# Patient Record
Sex: Male | Born: 1950 | Race: White | Marital: Married | Smoking: Never smoker
Health system: Southern US, Community
[De-identification: ages and names within clinical notes are randomized; demographics above are authoritative.]

## PROBLEM LIST (undated history)

## (undated) DIAGNOSIS — G4733 Obstructive sleep apnea (adult) (pediatric): Secondary | ICD-10-CM

## (undated) DIAGNOSIS — G4730 Sleep apnea, unspecified: Secondary | ICD-10-CM

## (undated) DIAGNOSIS — Z974 Presence of external hearing-aid: Secondary | ICD-10-CM

## (undated) DIAGNOSIS — E119 Type 2 diabetes mellitus without complications: Secondary | ICD-10-CM

## (undated) DIAGNOSIS — K2270 Barrett's esophagus without dysplasia: Secondary | ICD-10-CM

## (undated) DIAGNOSIS — R42 Dizziness and giddiness: Secondary | ICD-10-CM

## (undated) DIAGNOSIS — E291 Testicular hypofunction: Secondary | ICD-10-CM

## (undated) DIAGNOSIS — M1990 Unspecified osteoarthritis, unspecified site: Secondary | ICD-10-CM

## (undated) DIAGNOSIS — I10 Essential (primary) hypertension: Secondary | ICD-10-CM

---

## 2019-11-13 ENCOUNTER — Ambulatory Visit: Payer: Self-pay

## 2019-11-19 ENCOUNTER — Ambulatory Visit: Payer: Self-pay

## 2019-11-24 ENCOUNTER — Ambulatory Visit: Payer: Self-pay

## 2020-10-10 ENCOUNTER — Encounter: Payer: Self-pay | Admitting: Internal Medicine

## 2020-10-13 HISTORY — PX: BACK SURGERY: SHX140

## 2021-06-19 ENCOUNTER — Other Ambulatory Visit: Payer: Self-pay | Admitting: Family Medicine

## 2021-06-19 DIAGNOSIS — M5416 Radiculopathy, lumbar region: Secondary | ICD-10-CM

## 2021-07-10 ENCOUNTER — Ambulatory Visit
Admission: RE | Admit: 2021-07-10 | Discharge: 2021-07-10 | Disposition: A | Discharge status: Home / Self Care | Payer: Self-pay | Source: Ambulatory Visit | Attending: Family Medicine | Admitting: Family Medicine

## 2021-07-10 ENCOUNTER — Other Ambulatory Visit: Payer: Self-pay

## 2021-07-10 DIAGNOSIS — M5416 Radiculopathy, lumbar region: Secondary | ICD-10-CM

## 2021-09-12 HISTORY — PX: BACK SURGERY: SHX140

## 2022-04-25 ENCOUNTER — Other Ambulatory Visit: Payer: Self-pay

## 2022-04-25 ENCOUNTER — Inpatient Hospital Stay
Admission: EM | Admit: 2022-04-25 | Discharge: 2022-04-27 | DRG: 683 | Disposition: A | Discharge status: Home / Self Care | Payer: Medicare Other | Attending: Internal Medicine | Admitting: Internal Medicine

## 2022-04-25 DIAGNOSIS — E785 Hyperlipidemia, unspecified: Secondary | ICD-10-CM | POA: Diagnosis present

## 2022-04-25 DIAGNOSIS — Z6836 Body mass index (BMI) 36.0-36.9, adult: Secondary | ICD-10-CM | POA: Diagnosis not present

## 2022-04-25 DIAGNOSIS — E871 Hypo-osmolality and hyponatremia: Secondary | ICD-10-CM | POA: Diagnosis present

## 2022-04-25 DIAGNOSIS — N179 Acute kidney failure, unspecified: Principal | ICD-10-CM | POA: Diagnosis present

## 2022-04-25 DIAGNOSIS — E860 Dehydration: Secondary | ICD-10-CM | POA: Diagnosis present

## 2022-04-25 DIAGNOSIS — E876 Hypokalemia: Secondary | ICD-10-CM | POA: Diagnosis present

## 2022-04-25 DIAGNOSIS — R197 Diarrhea, unspecified: Secondary | ICD-10-CM | POA: Diagnosis present

## 2022-04-25 DIAGNOSIS — E119 Type 2 diabetes mellitus without complications: Secondary | ICD-10-CM | POA: Diagnosis present

## 2022-04-25 DIAGNOSIS — E1169 Type 2 diabetes mellitus with other specified complication: Secondary | ICD-10-CM

## 2022-04-25 DIAGNOSIS — E669 Obesity, unspecified: Secondary | ICD-10-CM | POA: Diagnosis present

## 2022-04-25 DIAGNOSIS — A020 Salmonella enteritis: Secondary | ICD-10-CM | POA: Diagnosis not present

## 2022-04-25 DIAGNOSIS — R7303 Prediabetes: Secondary | ICD-10-CM | POA: Diagnosis not present

## 2022-04-25 DIAGNOSIS — I10 Essential (primary) hypertension: Secondary | ICD-10-CM | POA: Diagnosis present

## 2022-04-25 DIAGNOSIS — A029 Salmonella infection, unspecified: Secondary | ICD-10-CM | POA: Diagnosis not present

## 2022-04-25 LAB — COMPREHENSIVE METABOLIC PANEL
ALT: 29 U/L (ref 0–44)
AST: 31 U/L (ref 15–41)
Albumin: 4.3 g/dL (ref 3.5–5.0)
Alkaline Phosphatase: 60 U/L (ref 38–126)
Anion gap: 16 — ABNORMAL HIGH (ref 5–15)
BUN: 45 mg/dL — ABNORMAL HIGH (ref 8–23)
CO2: 17 mmol/L — ABNORMAL LOW (ref 22–32)
Calcium: 8.5 mg/dL — ABNORMAL LOW (ref 8.9–10.3)
Chloride: 96 mmol/L — ABNORMAL LOW (ref 98–111)
Creatinine, Ser: 2.61 mg/dL — ABNORMAL HIGH (ref 0.61–1.24)
GFR, Estimated: 26 mL/min — ABNORMAL LOW (ref >60)
Glucose, Bld: 142 mg/dL — ABNORMAL HIGH (ref 70–99)
Potassium: 3.4 mmol/L — ABNORMAL LOW (ref 3.5–5.1)
Sodium: 129 mmol/L — ABNORMAL LOW (ref 135–145)
Total Bilirubin: 1 mg/dL (ref 0.3–1.2)
Total Protein: 8.4 g/dL — ABNORMAL HIGH (ref 6.5–8.1)

## 2022-04-25 LAB — CBC
HCT: 43.9 % (ref 39.0–52.0)
Hemoglobin: 14.5 g/dL (ref 13.0–17.0)
MCH: 29.2 pg (ref 26.0–34.0)
MCHC: 33 g/dL (ref 30.0–36.0)
MCV: 88.5 fL (ref 80.0–100.0)
Platelets: 316 10*3/uL (ref 150–400)
RBC: 4.96 MIL/uL (ref 4.22–5.81)
RDW: 14.9 % (ref 11.5–15.5)
WBC: 7.6 10*3/uL (ref 4.0–10.5)
nRBC: 0 % (ref 0.0–0.2)

## 2022-04-25 LAB — LIPASE, BLOOD: Lipase: 25 U/L (ref 11–51)

## 2022-04-25 MED ORDER — ALUM & MAG HYDROXIDE-SIMETH 200-200-20 MG/5ML PO SUSP
30.0000 mL | Freq: Once | ORAL | Status: AC
  Administered 2022-04-25: 30 mL via ORAL
  Filled 2022-04-25: qty 30

## 2022-04-25 MED ORDER — LIDOCAINE VISCOUS HCL 2 % MT SOLN
15.0000 mL | Freq: Once | OROMUCOSAL | Status: AC
  Administered 2022-04-25: 15 mL via ORAL
  Filled 2022-04-25: qty 15

## 2022-04-25 MED ORDER — SODIUM CHLORIDE 0.9 % IV SOLN: Freq: Once | INTRAVENOUS | Status: AC

## 2022-04-25 MED ORDER — SODIUM CHLORIDE 0.9 % IV BOLUS
1000.0000 mL | Freq: Once | INTRAVENOUS | Status: AC
  Administered 2022-04-25: 1000 mL via INTRAVENOUS

## 2022-04-25 NOTE — ED Triage Notes (Signed)
Pt to ED for nonstop watery diarrhea since 5 days ago. Has been dizzy for last several days. Has vomited about once/day. Is drinking water.   Pt was shivering yesterday, temp was 99.4. Has not taken antipyretics.  Denies blood in stool. Has been taking immodium with no relief. Denies recent abx use.  HR is 120. BP is 116/60 which pt states is low for him.

## 2022-04-25 NOTE — ED Notes (Signed)
Pt reports hiccoughs that last 4-5 hours at a time.  6 episodes of watery diarrhea since arriving in the ED.  Pt reports no abdominal pain

## 2022-04-25 NOTE — ED Provider Triage Note (Signed)
Emergency Medicine Provider Triage Evaluation Note  Kyle Washington , a 71 y.o. male  was evaluated in triage.  Pt complains of watery diarrhea x 5 days. No known fever. Vomiting 1x/day but able to tolerate fluids.  Physical Exam  BP 116/60   Pulse (!) 120   Temp 99 F (37.2 C) (Oral)   Resp 16   SpO2 93%  Gen:   Awake, no distress   Resp:  Normal effort  MSK:   Moves extremities without difficulty  Other:    Medical Decision Making  Medically screening exam initiated at 4:15 PM.  Appropriate orders placed.  Nikita Humble was informed that the remainder of the evaluation will be completed by another provider, this initial triage assessment does not replace that evaluation, and the importance of remaining in the ED until their evaluation is complete.    Chinita Pester, FNP 04/25/22 1620

## 2022-04-25 NOTE — ED Provider Notes (Signed)
[facility] Provider Note    Event Date/Time   First MD Initiated Contact with Patient 04/25/22 2054     (approximate)   History   Diarrhea and Dizziness   HPI  Kyle Washington is a 71 y.o. male who presents to the emergency department today because of concerns for diarrhea.  Started 5 days ago.  He has numerous episodes of diarrhea.  They are watery.  The patient states that it has been almost constant.  He has had to wear pull-ups because of it.  He denies any significant abdominal pain with this.  Has had nausea and decreased appetite.  Denies any unusual ingestions prior to the symptoms.  Denies any recent antibiotic use.  Denies any recent travel.  Denies history of abdominal issues.     Physical Exam   Triage Vital Signs: ED Triage Vitals  Enc Vitals Group     BP 04/25/22 1613 116/60     Pulse Rate 04/25/22 1613 (!) 120     Resp 04/25/22 1613 16     Temp 04/25/22 1613 99 F (37.2 C)     Temp Source 04/25/22 1613 Oral     SpO2 04/25/22 1613 93 %     Weight 04/25/22 1615 160 lb (72.6 kg)     Height 04/25/22 1615 6' (1.829 m)     Head Circumference --      Peak Flow --      Pain Score 04/25/22 1612 0     Pain Loc --      Pain Edu? --      Excl. in GC? --     Most recent vital signs: Vitals:   04/25/22 2038 04/25/22 2039  BP: (!) 114/57   Pulse: (!) 103 (!) 102  Resp: 17   Temp:    SpO2: 95% 96%    General: Awake, alert, oriented. CV:  Good peripheral perfusion. Tachycardia. Resp:  Normal effort. Lungs clear. Abd:  No distention. Non tender.    ED Results / Procedures / Treatments   Labs (all labs ordered are listed, but only abnormal results are displayed) Labs Reviewed  COMPREHENSIVE METABOLIC PANEL - Abnormal; Notable for the following components:      Result Value   Sodium 129 (*)    Potassium 3.4 (*)    Chloride 96 (*)    CO2 17 (*)    Glucose, Bld 142 (*)    BUN 45 (*)    Creatinine, Ser 2.61 (*)    Calcium 8.5  (*)    Total Protein 8.4 (*)    GFR, Estimated 26 (*)    Anion gap 16 (*)    All other components within normal limits  GASTROINTESTINAL PANEL BY PCR, STOOL (REPLACES STOOL CULTURE)  LIPASE, BLOOD  CBC  URINALYSIS, ROUTINE W REFLEX MICROSCOPIC     EKG  None   RADIOLOGY None   PROCEDURES:  Critical Care performed: No  Procedures   MEDICATIONS ORDERED IN ED: Medications  sodium chloride 0.9 % bolus 1,000 mL (0 mLs Intravenous Stopped 04/25/22 2039)     IMPRESSION / MDM / ASSESSMENT AND PLAN / ED COURSE  I reviewed the triage vital signs and the nursing notes.                              Differential diagnosis includes, but is not limited to, C. difficile, viral gastroenteritis, inflammatory bowel syndrome.  Patient's presentation is  most consistent with acute presentation with potential threat to life or bodily function.  Patient presented to the emergency department today because of concerns for profound diarrhea for the past 5 days.  Patient's blood work here does show AKI.  Additionally he has multiple electrolyte abnormalities.  Think this is related to volume depletion.  Will order stool studies.  We will plan on admission.  Additionally patient was having some hiccups so we will try GI cocktail to see if that helps. Discussed with Dr. Emmit Pomfret with the hospitalist service who will plan on admission.   FINAL CLINICAL IMPRESSION(S) / ED DIAGNOSES   Final diagnoses:  AKI (acute kidney injury) (HCC)  Diarrhea, unspecified type     Note:  This document was prepared using Dragon voice recognition software and may include unintentional dictation errors.    Phineas Semen, MD 04/25/22 [phone]

## 2022-04-25 NOTE — H&P (Signed)
History and Physical   [facility] [place] @ [facility] Admission History and Physical [employer], D.O.    Patient Name: Kyle Washington MR#: [id number] Date of Birth: 05/14/1951 Date of Admission: 04/25/2022  Referring MD/NP/PA: Dr. Derrill Kay Primary Care Physician: Lauro Regulus, MD  Chief Complaint:  Chief Complaint  Patient presents with   Diarrhea   Dizziness    HPI: Kyle Washington is a 71 y.o. male with a known history of HTN, HLD presents to the emergency department for evaluation of diarrhea.  Patient was in a usual state of health until five days ago when he began having watery diarrhea. Reports associated chills. .Has been eating and drinking. Denies recent antibiotic use.   Patient denies fevers, weakness, dizziness, chest pain, shortness of breath, N/V, abdominal pain, dysuria/frequency, changes in mental status.    Otherwise there has been no change in status. Patient has been taking medication as prescribed and there has been no recent change in medication or diet.  No recent antibiotics.  There has been no recent illness, hospitalizations, travel or sick contacts.    EMS/ED Course: Patient received NS, Maalox. Medical admission has been requested for further management of AKI 2/2 dehydration.  Review of Systems:  CONSTITUTIONAL: Positive chills. No fever, fatigue, weakness, weight gain/loss, headache. EYES: No blurry or double vision. ENT: No tinnitus, postnasal drip, redness or soreness of the oropharynx. RESPIRATORY: No cough, dyspnea, wheeze.  No hemoptysis.  CARDIOVASCULAR: No chest pain, palpitations, syncope, orthopnea. No lower extremity edema.  GASTROINTESTINAL: Positive watery diarrhea. No nausea, vomiting, abdominal pain, constipation.  No hematemesis, melena or hematochezia. GENITOURINARY: No dysuria, frequency, hematuria. ENDOCRINE: No polyuria or nocturia. No heat or cold intolerance. HEMATOLOGY: No anemia, bruising,  bleeding. INTEGUMENTARY: No rashes, ulcers, lesions. MUSCULOSKELETAL: No arthritis, gout, dyspnea. NEUROLOGIC: No numbness, tingling, ataxia, seizure-type activity, weakness. PSYCHIATRIC: No anxiety, depression, insomnia.   History reviewed. No pertinent past medical history.  Past Surgical History:  Procedure Laterality Date   BACK SURGERY  2022     reports that he has never smoked. He has never used smokeless tobacco. He reports current alcohol use. No history on file for drug use.  No Known Allergies  History reviewed. No pertinent family history.  Prior to Admission medications   Not on File    Physical Exam: Vitals:   04/25/22 1615 04/25/22 2038 04/25/22 2039 04/25/22 2100  BP:  (!) 114/57  110/61  Pulse:  (!) 103 (!) 102 98  Resp:  17  16  Temp:      TempSrc:      SpO2:  95% 96% 98%  Weight: 72.6 kg     Height: 6' (1.829 m)       GENERAL: 71 y.o.-year-old white male patient, well-developed, well-nourished lying in the bed in no acute distress.  Pleasant and cooperative.   HEENT: Head atraumatic, normocephalic. Pupils equal. Mucus membranes moist. NECK: Supple. No JVD. CHEST: Normal breath sounds bilaterally. No wheezing, rales, rhonchi or crackles. No use of accessory muscles of respiration.  No reproducible chest wall tenderness.  CARDIOVASCULAR: S1, S2 normal. No murmurs, rubs, or gallops. Cap refill <2 seconds. Pulses intact distally.  ABDOMEN: Soft, nondistended, nontender. No rebound, guarding, rigidity. Normoactive bowel sounds present in all four quadrants.  EXTREMITIES: No pedal edema, cyanosis, or clubbing. No calf tenderness or Homan's sign.  NEUROLOGIC: The patient is alert and oriented x 3. Cranial nerves II through XII are grossly intact with no focal sensorimotor deficit. PSYCHIATRIC:  Normal affect,  mood, thought content. SKIN: Warm, dry, and intact without obvious rash, lesion, or ulcer.    Labs on Admission:  CBC: Recent Labs  Lab  04/25/22 1617  WBC 7.6  HGB 14.5  HCT 43.9  MCV 88.5  PLT 316   Basic Metabolic Panel: Recent Labs  Lab 04/25/22 1617  NA 129*  K 3.4*  CL 96*  CO2 17*  GLUCOSE 142*  BUN 45*  CREATININE 2.61*  CALCIUM 8.5*   GFR: Estimated Creatinine Clearance: 27 mL/min (A) (by C-G formula based on SCr of 2.61 mg/dL (H)). Liver Function Tests: Recent Labs  Lab 04/25/22 1617  AST 31  ALT 29  ALKPHOS 60  BILITOT 1.0  PROT 8.4*  ALBUMIN 4.3   Recent Labs  Lab 04/25/22 1617  LIPASE 25   No results for input(s): "AMMONIA" in the last 168 hours. Coagulation Profile: No results for input(s): "INR", "PROTIME" in the last 168 hours. Cardiac Enzymes: No results for input(s): "CKTOTAL", "CKMB", "CKMBINDEX", "TROPONINI" in the last 168 hours. BNP (last 3 results) No results for input(s): "PROBNP" in the last 8760 hours. HbA1C: No results for input(s): "HGBA1C" in the last 72 hours. CBG: No results for input(s): "GLUCAP" in the last 168 hours. Lipid Profile: No results for input(s): "CHOL", "HDL", "LDLCALC", "TRIG", "CHOLHDL", "LDLDIRECT" in the last 72 hours. Thyroid Function Tests: No results for input(s): "TSH", "T4TOTAL", "FREET4", "T3FREE", "THYROIDAB" in the last 72 hours. Anemia Panel: No results for input(s): "VITAMINB12", "FOLATE", "FERRITIN", "TIBC", "IRON", "RETICCTPCT" in the last 72 hours. Urine analysis: No results found for: "COLORURINE", "APPEARANCEUR", "LABSPEC", "PHURINE", "GLUCOSEU", "HGBUR", "BILIRUBINUR", "KETONESUR", "PROTEINUR", "UROBILINOGEN", "NITRITE", "LEUKOCYTESUR" Sepsis Labs: @LABRCNTIP (procalcitonin:4,lacticidven:4) )No results found for this or any previous visit (from the past 240 hour(s)).   Radiological Exams on Admission: No results found.   Assessment/Plan  This is a 70 y.o. male with a history of HTN  now being admitted with:  #. Acute kidney injury likely 2/2 dehydration, diarrhea - Admit IP - IV fluids and repeat BMP in AM.  - Avoid  nephrotoxic medications  #. Mild hyponatremia 2/2 diarrhea - IVF  #. Watery diarrhea - Check stool studies - Contact precautions - Empiric abx started in ER  #. History of HTN - Continue Norvasc, Cozaar  #. History of HLD - Continue Lipitor  Admission status: IP IV Fluids: NS Diet/Nutrition: Heart healthy Consults called: None  DVT Px: Lovenox, SCDs and early ambulation. Code Status: Full Code  Disposition Plan: To home in 1-2 days  All the records are reviewed and case discussed with ED provider. Management plans discussed with the patient and/or family who express understanding and agree with plan of care.  Bralynn Velador D.O. on 04/25/2022 at 9:48 PM CC: Primary care physician; 04/27/2022, MD   04/25/2022, 9:48 PM

## 2022-04-26 DIAGNOSIS — R7303 Prediabetes: Secondary | ICD-10-CM

## 2022-04-26 DIAGNOSIS — A020 Salmonella enteritis: Secondary | ICD-10-CM

## 2022-04-26 LAB — GASTROINTESTINAL PANEL BY PCR, STOOL (REPLACES STOOL CULTURE)

## 2022-04-26 LAB — BASIC METABOLIC PANEL
Anion gap: 14 (ref 5–15)
BUN: 48 mg/dL — ABNORMAL HIGH (ref 8–23)
CO2: 17 mmol/L — ABNORMAL LOW (ref 22–32)
Calcium: 8.4 mg/dL — ABNORMAL LOW (ref 8.9–10.3)
Chloride: 96 mmol/L — ABNORMAL LOW (ref 98–111)
Creatinine, Ser: 2.35 mg/dL — ABNORMAL HIGH (ref 0.61–1.24)
GFR, Estimated: 29 mL/min — ABNORMAL LOW (ref >60)
Glucose, Bld: 123 mg/dL — ABNORMAL HIGH (ref 70–99)
Potassium: 2.6 mmol/L — CL (ref 3.5–5.1)
Sodium: 127 mmol/L — ABNORMAL LOW (ref 135–145)

## 2022-04-26 LAB — CBC
HCT: 42.6 % (ref 39.0–52.0)
Hemoglobin: 14.6 g/dL (ref 13.0–17.0)
MCH: 29.9 pg (ref 26.0–34.0)
MCHC: 34.3 g/dL (ref 30.0–36.0)
MCV: 87.3 fL (ref 80.0–100.0)
Platelets: 277 10*3/uL (ref 150–400)
RBC: 4.88 MIL/uL (ref 4.22–5.81)
RDW: 14.7 % (ref 11.5–15.5)
WBC: 5.4 10*3/uL (ref 4.0–10.5)
nRBC: 0 % (ref 0.0–0.2)

## 2022-04-26 LAB — C DIFFICILE QUICK SCREEN W PCR REFLEX
C Diff antigen: NEGATIVE
C Diff interpretation: NOT DETECTED
C Diff toxin: NEGATIVE

## 2022-04-26 LAB — HIV ANTIBODY (ROUTINE TESTING W REFLEX): HIV Screen 4th Generation wRfx: NONREACTIVE

## 2022-04-26 LAB — MAGNESIUM: Magnesium: 1.8 mg/dL (ref 1.7–2.4)

## 2022-04-26 LAB — PHOSPHORUS: Phosphorus: 4 mg/dL (ref 2.5–4.6)

## 2022-04-26 MED ORDER — METRONIDAZOLE 500 MG/100ML IV SOLN
500.0000 mg | Freq: Two times a day (BID) | INTRAVENOUS | Status: DC
  Administered 2022-04-26 (×2): 500 mg via INTRAVENOUS
  Filled 2022-04-26 (×2): qty 100

## 2022-04-26 MED ORDER — ESCITALOPRAM OXALATE 10 MG PO TABS
20.0000 mg | ORAL_TABLET | Freq: Every day | ORAL | Status: DC
  Administered 2022-04-26 – 2022-04-27 (×2): 20 mg via ORAL
  Filled 2022-04-26 (×2): qty 2

## 2022-04-26 MED ORDER — MAGNESIUM SULFATE 2 GM/50ML IV SOLN
2.0000 g | Freq: Once | INTRAVENOUS | Status: AC
  Administered 2022-04-26: 2 g via INTRAVENOUS
  Filled 2022-04-26: qty 50

## 2022-04-26 MED ORDER — LOSARTAN POTASSIUM 50 MG PO TABS
100.0000 mg | ORAL_TABLET | Freq: Every day | ORAL | Status: DC
Start: 2022-04-26 — End: 2022-04-26

## 2022-04-26 MED ORDER — CIPROFLOXACIN IN D5W 400 MG/200ML IV SOLN
400.0000 mg | Freq: Two times a day (BID) | INTRAVENOUS | Status: DC
  Administered 2022-04-26 (×2): 400 mg via INTRAVENOUS
  Filled 2022-04-26 (×2): qty 200

## 2022-04-26 MED ORDER — ACETAMINOPHEN 325 MG PO TABS: 650.0000 mg | ORAL_TABLET | Freq: Four times a day (QID) | ORAL | Status: DC | PRN

## 2022-04-26 MED ORDER — MORPHINE SULFATE (PF) 2 MG/ML IV SOLN: 1.0000 mg | Freq: Four times a day (QID) | INTRAVENOUS | Status: DC | PRN

## 2022-04-26 MED ORDER — ENOXAPARIN SODIUM 40 MG/0.4ML IJ SOSY
40.0000 mg | PREFILLED_SYRINGE | INTRAMUSCULAR | Status: DC
  Administered 2022-04-27: 40 mg via SUBCUTANEOUS
  Filled 2022-04-26: qty 0.4

## 2022-04-26 MED ORDER — HYDROCODONE-ACETAMINOPHEN 5-325 MG PO TABS: 1.0000 | ORAL_TABLET | ORAL | Status: DC | PRN

## 2022-04-26 MED ORDER — ENOXAPARIN SODIUM 30 MG/0.3ML IJ SOSY
30.0000 mg | PREFILLED_SYRINGE | INTRAMUSCULAR | Status: DC
  Administered 2022-04-26: 30 mg via SUBCUTANEOUS
  Filled 2022-04-26: qty 0.3

## 2022-04-26 MED ORDER — TRAZODONE HCL 50 MG PO TABS
50.0000 mg | ORAL_TABLET | Freq: Every evening | ORAL | Status: DC | PRN
  Filled 2022-04-26: qty 1

## 2022-04-26 MED ORDER — ACETAMINOPHEN 650 MG RE SUPP: 650.0000 mg | Freq: Four times a day (QID) | RECTAL | Status: DC | PRN

## 2022-04-26 MED ORDER — AMLODIPINE BESYLATE 5 MG PO TABS
5.0000 mg | ORAL_TABLET | Freq: Every day | ORAL | Status: DC
  Administered 2022-04-26 – 2022-04-27 (×2): 5 mg via ORAL
  Filled 2022-04-26 (×2): qty 1

## 2022-04-26 MED ORDER — CHOLESTYRAMINE 4 G PO PACK
4.0000 g | PACK | Freq: Two times a day (BID) | ORAL | Status: DC
  Administered 2022-04-26 – 2022-04-27 (×3): 4 g via ORAL
  Filled 2022-04-26 (×3): qty 1

## 2022-04-26 MED ORDER — ONDANSETRON HCL 4 MG PO TABS
4.0000 mg | ORAL_TABLET | Freq: Four times a day (QID) | ORAL | Status: DC | PRN
  Administered 2022-04-27: 4 mg via ORAL
  Filled 2022-04-26: qty 1

## 2022-04-26 MED ORDER — SODIUM CHLORIDE 0.9 % IV SOLN: INTRAVENOUS | Status: DC

## 2022-04-26 MED ORDER — POTASSIUM CHLORIDE CRYS ER 20 MEQ PO TBCR
40.0000 meq | EXTENDED_RELEASE_TABLET | Freq: Two times a day (BID) | ORAL | Status: AC
  Administered 2022-04-26 (×2): 40 meq via ORAL
  Filled 2022-04-26 (×2): qty 2

## 2022-04-26 MED ORDER — ONDANSETRON HCL 4 MG/2ML IJ SOLN
4.0000 mg | Freq: Four times a day (QID) | INTRAMUSCULAR | Status: DC | PRN
  Administered 2022-04-26 (×3): 4 mg via INTRAVENOUS
  Filled 2022-04-26 (×3): qty 2

## 2022-04-26 MED ORDER — PROCHLORPERAZINE EDISYLATE 10 MG/2ML IJ SOLN
10.0000 mg | Freq: Four times a day (QID) | INTRAMUSCULAR | Status: DC | PRN
  Administered 2022-04-26 – 2022-04-27 (×2): 10 mg via INTRAVENOUS
  Filled 2022-04-26 (×3): qty 2

## 2022-04-26 MED ORDER — ATORVASTATIN CALCIUM 20 MG PO TABS
20.0000 mg | ORAL_TABLET | Freq: Every day | ORAL | Status: DC
  Administered 2022-04-26 – 2022-04-27 (×2): 20 mg via ORAL
  Filled 2022-04-26 (×2): qty 1

## 2022-04-26 MED ORDER — VITAMIN B-12 1000 MCG PO TABS
1000.0000 ug | ORAL_TABLET | Freq: Every day | ORAL | Status: DC
  Administered 2022-04-26 – 2022-04-27 (×2): 1000 ug via ORAL
  Filled 2022-04-26 (×3): qty 1

## 2022-04-26 MED ORDER — CHLORPROMAZINE HCL 25 MG PO TABS
25.0000 mg | ORAL_TABLET | Freq: Four times a day (QID) | ORAL | Status: DC | PRN
  Filled 2022-04-26: qty 1

## 2022-04-26 MED ORDER — ALBUTEROL SULFATE (2.5 MG/3ML) 0.083% IN NEBU: 2.5000 mg | INHALATION_SOLUTION | Freq: Four times a day (QID) | RESPIRATORY_TRACT | Status: DC | PRN

## 2022-04-26 MED ORDER — IPRATROPIUM BROMIDE 0.02 % IN SOLN: 0.5000 mg | Freq: Four times a day (QID) | RESPIRATORY_TRACT | Status: DC | PRN

## 2022-04-26 NOTE — Plan of Care (Signed)

## 2022-04-26 NOTE — Progress Notes (Signed)
Pt attempted to use bedside commode but was not able to make it without it getting onto the floor and bed. Pt very weak and takes time to get to the [facility] from the bed. Frequent events of liquidy greenish stool.  Patient is now back in bed and is resting with spouse at the bedside. Will continue to monitor to end of shift.

## 2022-04-26 NOTE — Progress Notes (Signed)
PROGRESS NOTE    Kyle Washington  OLM:[id number] DOB: [birth year] DOA: 04/25/2022 PCP: Lauro Regulus, MD   Assessment & Plan:   Principal Problem:   AKI (acute kidney injury) (HCC)  Assessment and Plan: AKI: likely secondary to dehydration & diarrhea. Continue on IVFs. Cr is trending down today   Hyponatremia: continue on IVFs  Hypokalemia: potassium given   Diarrhea: secondary to salmonella found on GI PCR panel. C. Diff was neg. Will continue w/ supportive care. Hold off on further abxs as abxs are for severe cases only   HTN: continue on amlodipine. Hold losartan secondary to AKI   HLD: continue on statin   Obesity: BMI 36.1. Complicates overall care & prognosis  Pre-DM: HbA1c 6.3. Needs pre-DM education   DVT prophylaxis: lovenox  Code Status: full  Family Communication:  Disposition Plan: will likely d/c back home   Level of care: Med-Surg  Status is: Inpatient Remains inpatient appropriate because: severity of illness   Consultants:    Procedures:  Antimicrobials:   Subjective: Pt c/o diarrhea  Objective: Vitals:   04/26/22 0531 04/26/22 0649 04/26/22 0654 04/26/22 0811  BP: 110/70 124/76  137/74  Pulse: (!) 104 (!) 101  98  Resp: 19 20  18   Temp: 97.8 F (36.6 C) 98.6 F (37 C)  98 F (36.7 C)  TempSrc: Oral     SpO2: 96% 95%  95%  Weight:   120.9 kg   Height:        Intake/Output Summary (Last 24 hours) at 04/26/2022 1444 Last data filed at 04/26/2022 1413 Gross per 24 hour  Intake 1009.67 ml  Output --  Net 1009.67 ml   Filed Weights   04/25/22 1615 04/26/22 0654  Weight: 72.6 kg 120.9 kg    Examination:  General exam: Appears calm and comfortable  Respiratory system: Clear to auscultation. Respiratory effort normal. Cardiovascular system: S1 & S2 +. No rubs, gallops or clicks.  Gastrointestinal system: Abdomen is obese, soft and nontender.  Hyperactive bowel sounds heard. Central nervous system: Alert and oriented. Moves  all extremities  Psychiatry: Judgement and insight appear normal. Mood & affect appropriate.     Data Reviewed: I have personally reviewed following labs and imaging studies  CBC: Recent Labs  Lab 04/25/22 1617 04/26/22 0738  WBC 7.6 5.4  HGB 14.5 14.6  HCT 43.9 42.6  MCV 88.5 87.3  PLT 316 277   Basic Metabolic Panel: Recent Labs  Lab 04/25/22 1617 04/26/22 0738 04/26/22 0830  NA 129* 127*  --   K 3.4* 2.6*  --   CL 96* 96*  --   CO2 17* 17*  --   GLUCOSE 142* 123*  --   BUN 45* 48*  --   CREATININE 2.61* 2.35*  --   CALCIUM 8.5* 8.4*  --   MG  --   --  1.8  PHOS  --   --  4.0   GFR: Estimated Creatinine Clearance: 39.3 mL/min (A) (by C-G formula based on SCr of 2.35 mg/dL (H)). Liver Function Tests: Recent Labs  Lab 04/25/22 1617  AST 31  ALT 29  ALKPHOS 60  BILITOT 1.0  PROT 8.4*  ALBUMIN 4.3   Recent Labs  Lab 04/25/22 1617  LIPASE 25   No results for input(s): "AMMONIA" in the last 168 hours. Coagulation Profile: No results for input(s): "INR", "PROTIME" in the last 168 hours. Cardiac Enzymes: No results for input(s): "CKTOTAL", "CKMB", "CKMBINDEX", "TROPONINI" in the last 168 hours. BNP (  last 3 results) No results for input(s): "PROBNP" in the last 8760 hours. HbA1C: No results for input(s): "HGBA1C" in the last 72 hours. CBG: No results for input(s): "GLUCAP" in the last 168 hours. Lipid Profile: No results for input(s): "CHOL", "HDL", "LDLCALC", "TRIG", "CHOLHDL", "LDLDIRECT" in the last 72 hours. Thyroid Function Tests: No results for input(s): "TSH", "T4TOTAL", "FREET4", "T3FREE", "THYROIDAB" in the last 72 hours. Anemia Panel: No results for input(s): "VITAMINB12", "FOLATE", "FERRITIN", "TIBC", "IRON", "RETICCTPCT" in the last 72 hours. Sepsis Labs: No results for input(s): "PROCALCITON", "LATICACIDVEN" in the last 168 hours.  Recent Results (from the past 240 hour(s))  Gastrointestinal Panel by PCR , Stool     Status: Abnormal    Collection Time: 04/26/22  8:50 AM   Specimen: Stool  Result Value Ref Range Status   Campylobacter species NOT DETECTED NOT DETECTED Final   Plesimonas shigelloides NOT DETECTED NOT DETECTED Final   Salmonella species DETECTED (A) NOT DETECTED Final    Comment: RESULT CALLED TO, READ BACK BY AND VERIFIED WITH: ELIZABETH RICHARDSON 04/26/22 1024 SLM    Yersinia enterocolitica NOT DETECTED NOT DETECTED Final   Vibrio species NOT DETECTED NOT DETECTED Final   Vibrio cholerae NOT DETECTED NOT DETECTED Final   Enteroaggregative E coli (EAEC) NOT DETECTED NOT DETECTED Final   Enteropathogenic E coli (EPEC) NOT DETECTED NOT DETECTED Final   Enterotoxigenic E coli (ETEC) NOT DETECTED NOT DETECTED Final   Shiga like toxin producing E coli (STEC) NOT DETECTED NOT DETECTED Final   Shigella/Enteroinvasive E coli (EIEC) NOT DETECTED NOT DETECTED Final   Cryptosporidium NOT DETECTED NOT DETECTED Final   Cyclospora cayetanensis NOT DETECTED NOT DETECTED Final   Entamoeba histolytica NOT DETECTED NOT DETECTED Final   Giardia lamblia NOT DETECTED NOT DETECTED Final   Adenovirus F40/41 NOT DETECTED NOT DETECTED Final   Astrovirus NOT DETECTED NOT DETECTED Final   Norovirus GI/GII NOT DETECTED NOT DETECTED Final   Rotavirus A NOT DETECTED NOT DETECTED Final   Sapovirus (I, II, IV, and V) NOT DETECTED NOT DETECTED Final    Comment: Performed at [facility], [address] [place], [place] [address]  C Difficile Quick Screen w PCR reflex     Status: None   Collection Time: 04/26/22  8:50 AM   Specimen: Stool  Result Value Ref Range Status   C Diff antigen NEGATIVE NEGATIVE Final   C Diff toxin NEGATIVE NEGATIVE Final   C Diff interpretation No C. difficile detected.  Final    Comment: Performed at [facility], [address] [place], [place] [address]         Radiology Studies: No results found.      Scheduled Meds:  amLODipine  5 mg Oral Daily   atorvastatin  20  mg Oral Daily   cholestyramine  4 g Oral BID   [START ON 04/27/2022] enoxaparin (LOVENOX) injection  40 mg Subcutaneous Q24H   escitalopram  20 mg Oral Daily   potassium chloride  40 mEq Oral BID   vitamin B-12  1,000 mcg Oral Daily   Continuous Infusions:  sodium chloride 75 mL/hr at 04/26/22 1413   magnesium sulfate bolus IVPB       LOS: 1 day    Time spent: 35 mins     Charise Killian, MD [facility] Pager [phone]  If 7PM-7AM, please contact night-coverage [email or web address] 04/26/2022, 2:44 PM

## 2022-04-26 NOTE — Progress Notes (Signed)
PHARMACIST - PHYSICIAN COMMUNICATION  CONCERNING:  Enoxaparin (Lovenox) for DVT Prophylaxis    RECOMMENDATION: Patient was prescribed enoxaparin 30 mg q24 hours for VTE prophylaxis.   Filed Weights   04/25/22 1615 04/26/22 0654  Weight: 72.6 kg (160 lb) 120.9 kg (266 lb 9.6 oz)    Body mass index is 36.16 kg/m.  Estimated Creatinine Clearance: 39.3 mL/min (A) (by C-G formula based on SCr of 2.35 mg/dL (H)).   Based on [facility] policy patient is candidate for enoxaparin 0.5mg /kg TBW SQ every 24 hours based on BMI being >30.  Given borderline CrCl (down-trending), will increase Lovenox to 40 mg daily for now with consideration for 0.5 mg/kg/day dosing if Scr continues to improve  DESCRIPTION: Pharmacy has adjusted enoxaparin dose per [facility] policy.  Patient is now receiving enoxaparin 40 mg every 24 hours    Tressie Ellis 04/26/2022 10:37 AM

## 2022-04-27 DIAGNOSIS — E876 Hypokalemia: Secondary | ICD-10-CM

## 2022-04-27 DIAGNOSIS — A029 Salmonella infection, unspecified: Secondary | ICD-10-CM

## 2022-04-27 LAB — BASIC METABOLIC PANEL
Anion gap: 15 (ref 5–15)
BUN: 44 mg/dL — ABNORMAL HIGH (ref 8–23)
CO2: 18 mmol/L — ABNORMAL LOW (ref 22–32)
Calcium: 8.9 mg/dL (ref 8.9–10.3)
Chloride: 98 mmol/L (ref 98–111)
Creatinine, Ser: 2.04 mg/dL — ABNORMAL HIGH (ref 0.61–1.24)
GFR, Estimated: 34 mL/min — ABNORMAL LOW (ref >60)
Glucose, Bld: 144 mg/dL — ABNORMAL HIGH (ref 70–99)
Potassium: 2.9 mmol/L — ABNORMAL LOW (ref 3.5–5.1)
Sodium: 131 mmol/L — ABNORMAL LOW (ref 135–145)

## 2022-04-27 LAB — CBC
HCT: 45.6 % (ref 39.0–52.0)
Hemoglobin: 15.1 g/dL (ref 13.0–17.0)
MCH: 28.6 pg (ref 26.0–34.0)
MCHC: 33.1 g/dL (ref 30.0–36.0)
MCV: 86.4 fL (ref 80.0–100.0)
Platelets: 318 10*3/uL (ref 150–400)
RBC: 5.28 MIL/uL (ref 4.22–5.81)
RDW: 14.5 % (ref 11.5–15.5)
WBC: 6.2 10*3/uL (ref 4.0–10.5)
nRBC: 0 % (ref 0.0–0.2)

## 2022-04-27 LAB — PHOSPHORUS: Phosphorus: 3.7 mg/dL (ref 2.5–4.6)

## 2022-04-27 LAB — MAGNESIUM: Magnesium: 2.7 mg/dL — ABNORMAL HIGH (ref 1.7–2.4)

## 2022-04-27 MED ORDER — CHOLESTYRAMINE 4 G PO PACK: 4.0000 g | PACK | Freq: Two times a day (BID) | ORAL | 0 refills | Status: DC

## 2022-04-27 MED ORDER — ONDANSETRON HCL 8 MG PO TABS: 8.0000 mg | ORAL_TABLET | Freq: Three times a day (TID) | ORAL | 0 refills | Status: AC | PRN

## 2022-04-27 MED ORDER — POTASSIUM CHLORIDE CRYS ER 20 MEQ PO TBCR
40.0000 meq | EXTENDED_RELEASE_TABLET | Freq: Two times a day (BID) | ORAL | Status: DC
  Administered 2022-04-27: 40 meq via ORAL
  Filled 2022-04-27: qty 2

## 2022-04-27 NOTE — TOC CM/SW Note (Signed)
  Transition of Care [facility]) Screening Note   Patient Details  Name: Kyle Washington Date of Birth: 1951/07/15   Transition of Care [facility]) CM/SW Contact:    Margarito Liner, LCSW Phone Number: 04/27/2022, 11:47 AM    Transition of Care Department [facility]) has reviewed patient and no TOC needs have been identified at this time. We will continue to monitor patient advancement through interdisciplinary progression rounds. If new patient transition needs arise, please place a TOC consult.

## 2022-04-27 NOTE — Discharge Summary (Signed)
Physician Discharge Summary  [name] NKN:[id number] DOB: [birth year] DOA: 04/25/2022  PCP: Lauro Regulus, MD  Admit date: 04/25/2022 Discharge date: 04/27/2022  Admitted From: home  Disposition:  home   Recommendations for Outpatient Follow-up:  Follow up with PCP in 1-2 weeks   Home Health: no  Equipment/Devices:  Discharge Condition: stable  CODE STATUS: full  Diet recommendation: Heart Healthy / Carb Modified   Brief/Interim Summary: HPI was taken from Dr. Emmit Pomfret: Kyle Washington is a 71 y.o. male with a known history of HTN, HLD presents to the emergency department for evaluation of diarrhea.  Patient was in a usual state of health until five days ago when he began having watery diarrhea. Reports associated chills. .Has been eating and drinking. Denies recent antibiotic use or sick contacts.  Also c/o hiccups x 2 days   Patient denies fevers, weakness, dizziness, chest pain, shortness of breath, N/V, abdominal pain, dysuria/frequency, changes in mental status.    Otherwise there has been no change in status. Patient has been taking medication as prescribed and there has been no recent change in medication or diet.  No recent antibiotics.  There has been no recent illness, hospitalizations, travel or sick contacts.     EMS/ED Course: Patient received NS, Maalox. Medical admission has been requested for further management of AKI 2/2 dehydration.  As per Dr. Mayford Knife 7/15-7/16/23: Pt presented w/ diarrhea secondary to salmonella found on GI PCR panel. C. diff testing was neg. Pt was treated w/ supportive care w/ IVFs, electrolyte replacement and cholestyramine to help bulk up stools. No fevers and normal WBC throughout hospital stay. Of note, pt was found to AKI likely secondary to dehydration & diarrhea. Cr was trending down on the day of d/c.  Discharge Diagnoses:  Principal Problem:   AKI (acute kidney injury) (HCC) AKI: likely secondary to dehydration & diarrhea.  Continue on IVFs. Cr is trending down daily   Hyponatremia: improving w/ IVFs   Hypokalemia: KCl repleated  Diarrhea: secondary to salmonella found on GI PCR panel. C. diff was neg. Will continue w/ supportive care. Hold off on further abxs as abxs are for severe cases only   HTN: continue on amlodipine. Can restart losartan at d/c   HLD: continue on statin   Obesity: BMI 36.1. Complicates overall care & prognosis  DM2: HbA1c 6.1, well controlled. Continue on home anti-DM meds at d/c    Discharge Instructions  Discharge Instructions     Diet - low sodium heart healthy   Complete by: As directed    Diet Carb Modified   Complete by: As directed    Discharge instructions   Complete by: As directed    F/u w/ PCP in 1-2 weeks. Stay hydrated with water, pedialyte   Increase activity slowly   Complete by: As directed       Allergies as of 04/27/2022   No Known Allergies      Medication List     TAKE these medications    amLODipine 5 MG tablet Commonly known as: NORVASC Take 5 mg by mouth daily.   atorvastatin 20 MG tablet Commonly known as: LIPITOR Take 20 mg by mouth daily.   cholestyramine 4 g packet Commonly known as: QUESTRAN Take 1 packet (4 g total) by mouth 2 (two) times daily for 14 days.   escitalopram 20 MG tablet Commonly known as: LEXAPRO Take 20 mg by mouth daily.   Fish Oil 1000 MG Caps Take 1 capsule by mouth daily.  losartan 100 MG tablet Commonly known as: COZAAR Take 100 mg by mouth daily.   Melatonin 10 MG Tabs Take 10 mg by mouth at bedtime.   omeprazole 20 MG capsule Commonly known as: PRILOSEC Take 20 mg by mouth daily.   ondansetron 8 MG tablet Commonly known as: Zofran Take 1 tablet (8 mg total) by mouth every 8 (eight) hours as needed for up to 10 days for nausea or vomiting.   One-A-Day Mens 50+ Advantage Tabs Take 1 tablet by mouth daily.   Ozempic (0.25 or 0.5 MG/DOSE) 2 MG/3ML Sopn Generic drug: Semaglutide(0.25 or  0.5MG /DOS) Inject 0.75 mg into the skin once a week.   Qsymia 7.5-46 MG Cp24 Generic drug: Phentermine-Topiramate Take 1 capsule by mouth every morning. What changed: Another medication with the same name was removed. Continue taking this medication, and follow the directions you see here.   testosterone cypionate 200 MG/ML injection Commonly known as: DEPOTESTOSTERONE CYPIONATE SMARTSIG:0.45 Milliliter(s) IM Every 2 Weeks   traZODone 50 MG tablet Commonly known as: DESYREL Take 50-100 mg by mouth at bedtime as needed.   vitamin B-12 1000 MCG tablet Commonly known as: CYANOCOBALAMIN Take 1,000 mcg by mouth daily.   Vitamin D-1000 Max St 25 MCG (1000 UT) tablet Generic drug: Cholecalciferol Take 1,000 Units by mouth daily.   Xigduo XR 02-999 MG Tb24 Generic drug: Dapagliflozin-metFORMIN HCl ER Take 2 tablets by mouth every morning.        No Known Allergies  Consultations:    Procedures/Studies: No results found. (Echo, Carotid, EGD, Colonoscopy, ERCP)    Subjective: Pt c/o diarrhea but improved frequency of episodes from day prior    Discharge Exam: Vitals:   04/27/22 0422 04/27/22 0750  BP: 138/75 (!) 144/84  Pulse: 83 80  Resp: 16 16  Temp: 98.9 F (37.2 C) 98.5 F (36.9 C)  SpO2: 94% 96%   Vitals:   04/26/22 1946 04/27/22 0029 04/27/22 0422 04/27/22 0750  BP: 128/74  138/75 (!) 144/84  Pulse: 94  83 80  Resp: 20 14 16 16   Temp: 98.9 F (37.2 C)  98.9 F (37.2 C) 98.5 F (36.9 C)  TempSrc: Oral  Oral Oral  SpO2: 92%  94% 96%  Weight:      Height:        General: Pt is alert, awake, not in acute distress Cardiovascular: S1/S2 +, no rubs, no gallops Respiratory: CTA bilaterally, no wheezing, no rhonchi Abdominal: Soft, NT, obese, bowel sounds + Extremities: no cyanosis    The results of significant diagnostics from this hospitalization (including imaging, microbiology, ancillary and laboratory) are listed below for reference.      Microbiology: Recent Results (from the past 240 hour(s))  Gastrointestinal Panel by PCR , Stool     Status: Abnormal   Collection Time: 04/26/22  8:50 AM   Specimen: Stool  Result Value Ref Range Status   Campylobacter species NOT DETECTED NOT DETECTED Final   Plesimonas shigelloides NOT DETECTED NOT DETECTED Final   Salmonella species DETECTED (A) NOT DETECTED Final    Comment: RESULT CALLED TO, READ BACK BY AND VERIFIED WITH: ELIZABETH RICHARDSON 04/26/22 1024 SLM    Yersinia enterocolitica NOT DETECTED NOT DETECTED Final   Vibrio species NOT DETECTED NOT DETECTED Final   Vibrio cholerae NOT DETECTED NOT DETECTED Final   Enteroaggregative E coli (EAEC) NOT DETECTED NOT DETECTED Final   Enteropathogenic E coli (EPEC) NOT DETECTED NOT DETECTED Final   Enterotoxigenic E coli (ETEC) NOT DETECTED NOT DETECTED  Final   Shiga like toxin producing E coli (STEC) NOT DETECTED NOT DETECTED Final   Shigella/Enteroinvasive E coli (EIEC) NOT DETECTED NOT DETECTED Final   Cryptosporidium NOT DETECTED NOT DETECTED Final   Cyclospora cayetanensis NOT DETECTED NOT DETECTED Final   Entamoeba histolytica NOT DETECTED NOT DETECTED Final   Giardia lamblia NOT DETECTED NOT DETECTED Final   Adenovirus F40/41 NOT DETECTED NOT DETECTED Final   Astrovirus NOT DETECTED NOT DETECTED Final   Norovirus GI/GII NOT DETECTED NOT DETECTED Final   Rotavirus A NOT DETECTED NOT DETECTED Final   Sapovirus (I, II, IV, and V) NOT DETECTED NOT DETECTED Final    Comment: Performed at [facility], [address] [place], [place] [address]  C Difficile Quick Screen w PCR reflex     Status: None   Collection Time: 04/26/22  8:50 AM   Specimen: Stool  Result Value Ref Range Status   C Diff antigen NEGATIVE NEGATIVE Final   C Diff toxin NEGATIVE NEGATIVE Final   C Diff interpretation No C. difficile detected.  Final    Comment: Performed at [facility], [address] [place], [place] [address]      Labs: BNP (last 3 results) No results for input(s): "BNP" in the last 8760 hours. Basic Metabolic Panel: Recent Labs  Lab 04/25/22 1617 04/26/22 0738 04/26/22 0830 04/27/22 0411  NA 129* 127*  --  131*  K 3.4* 2.6*  --  2.9*  CL 96* 96*  --  98  CO2 17* 17*  --  18*  GLUCOSE 142* 123*  --  144*  BUN 45* 48*  --  44*  CREATININE 2.61* 2.35*  --  2.04*  CALCIUM 8.5* 8.4*  --  8.9  MG  --   --  1.8 2.7*  PHOS  --   --  4.0 3.7   Liver Function Tests: Recent Labs  Lab 04/25/22 1617  AST 31  ALT 29  ALKPHOS 60  BILITOT 1.0  PROT 8.4*  ALBUMIN 4.3   Recent Labs  Lab 04/25/22 1617  LIPASE 25   No results for input(s): "AMMONIA" in the last 168 hours. CBC: Recent Labs  Lab 04/25/22 1617 04/26/22 0738 04/27/22 0411  WBC 7.6 5.4 6.2  HGB 14.5 14.6 15.1  HCT 43.9 42.6 45.6  MCV 88.5 87.3 86.4  PLT 316 277 318   Cardiac Enzymes: No results for input(s): "CKTOTAL", "CKMB", "CKMBINDEX", "TROPONINI" in the last 168 hours. BNP: Invalid input(s): "POCBNP" CBG: No results for input(s): "GLUCAP" in the last 168 hours. D-Dimer No results for input(s): "DDIMER" in the last 72 hours. Hgb A1c No results for input(s): "HGBA1C" in the last 72 hours. Lipid Profile No results for input(s): "CHOL", "HDL", "LDLCALC", "TRIG", "CHOLHDL", "LDLDIRECT" in the last 72 hours. Thyroid function studies No results for input(s): "TSH", "T4TOTAL", "T3FREE", "THYROIDAB" in the last 72 hours.  Invalid input(s): "FREET3" Anemia work up No results for input(s): "VITAMINB12", "FOLATE", "FERRITIN", "TIBC", "IRON", "RETICCTPCT" in the last 72 hours. Urinalysis No results found for: "COLORURINE", "APPEARANCEUR", "LABSPEC", "PHURINE", "GLUCOSEU", "HGBUR", "BILIRUBINUR", "KETONESUR", "PROTEINUR", "UROBILINOGEN", "NITRITE", "LEUKOCYTESUR" Sepsis Labs Recent Labs  Lab 04/25/22 1617 04/26/22 0738 04/27/22 0411  WBC 7.6 5.4 6.2   Microbiology Recent Results (from the past 240 hour(s))   Gastrointestinal Panel by PCR , Stool     Status: Abnormal   Collection Time: 04/26/22  8:50 AM   Specimen: Stool  Result Value Ref Range Status   Campylobacter species NOT DETECTED NOT  DETECTED Final   Plesimonas shigelloides NOT DETECTED NOT DETECTED Final   Salmonella species DETECTED (A) NOT DETECTED Final    Comment: RESULT CALLED TO, READ BACK BY AND VERIFIED WITH: ELIZABETH RICHARDSON 04/26/22 1024 SLM    Yersinia enterocolitica NOT DETECTED NOT DETECTED Final   Vibrio species NOT DETECTED NOT DETECTED Final   Vibrio cholerae NOT DETECTED NOT DETECTED Final   Enteroaggregative E coli (EAEC) NOT DETECTED NOT DETECTED Final   Enteropathogenic E coli (EPEC) NOT DETECTED NOT DETECTED Final   Enterotoxigenic E coli (ETEC) NOT DETECTED NOT DETECTED Final   Shiga like toxin producing E coli (STEC) NOT DETECTED NOT DETECTED Final   Shigella/Enteroinvasive E coli (EIEC) NOT DETECTED NOT DETECTED Final   Cryptosporidium NOT DETECTED NOT DETECTED Final   Cyclospora cayetanensis NOT DETECTED NOT DETECTED Final   Entamoeba histolytica NOT DETECTED NOT DETECTED Final   Giardia lamblia NOT DETECTED NOT DETECTED Final   Adenovirus F40/41 NOT DETECTED NOT DETECTED Final   Astrovirus NOT DETECTED NOT DETECTED Final   Norovirus GI/GII NOT DETECTED NOT DETECTED Final   Rotavirus A NOT DETECTED NOT DETECTED Final   Sapovirus (I, II, IV, and V) NOT DETECTED NOT DETECTED Final    Comment: Performed at [facility], [address] [place], [place] [address]  C Difficile Quick Screen w PCR reflex     Status: None   Collection Time: 04/26/22  8:50 AM   Specimen: Stool  Result Value Ref Range Status   C Diff antigen NEGATIVE NEGATIVE Final   C Diff toxin NEGATIVE NEGATIVE Final   C Diff interpretation No C. difficile detected.  Final    Comment: Performed at [facility], [address] [place], [place] [address]     Time coordinating discharge: Over 30  minutes  SIGNED:   Wyvonnia Dusky, MD  [facility] 04/27/2022, 12:21 PM Pager   If 7PM-7AM, please contact night-coverage [email or web address]

## 2022-05-16 IMAGING — MR MR LUMBAR SPINE W/O CM
4 of 8 series · 14 of 48 positions shown · non-contrast
Comparison: None.

CLINICAL DATA: Low back and right leg pain with weakness and
numbness.

EXAM:
MRI LUMBAR SPINE WITHOUT CONTRAST
TECHNIQUE: Multiplanar, multisequence MR imaging of the lumbar spine was
performed. No intravenous contrast was administered.

[Series 5: T2 · sagittal · 4.0mm · 0.73mm/px · 3 of 15 slices shown (1 of 4)]
[im 1/15]
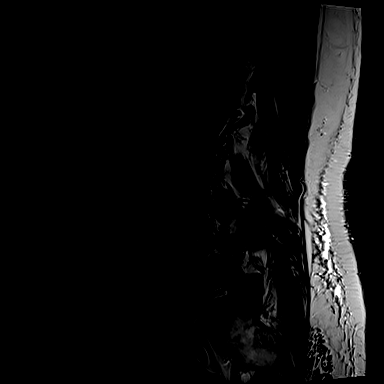
[im 8/15]
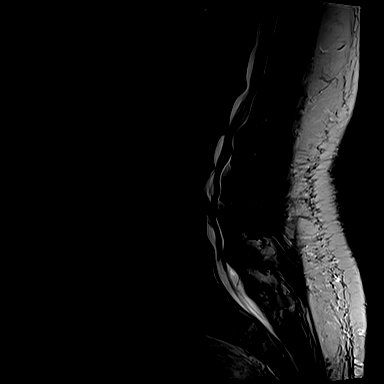
[im 15/15]
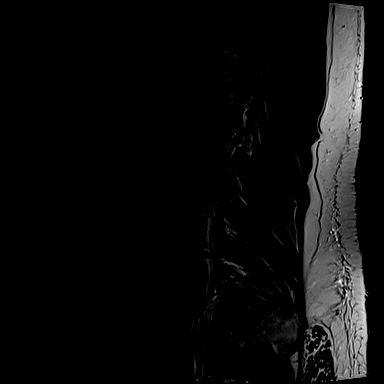

[Series 10: T2 · axial · 4.0mm · 0.28mm/px · z∈[+48,+137]mm · 5 of 19 slices shown (2 of 4)]
[im 1/19]
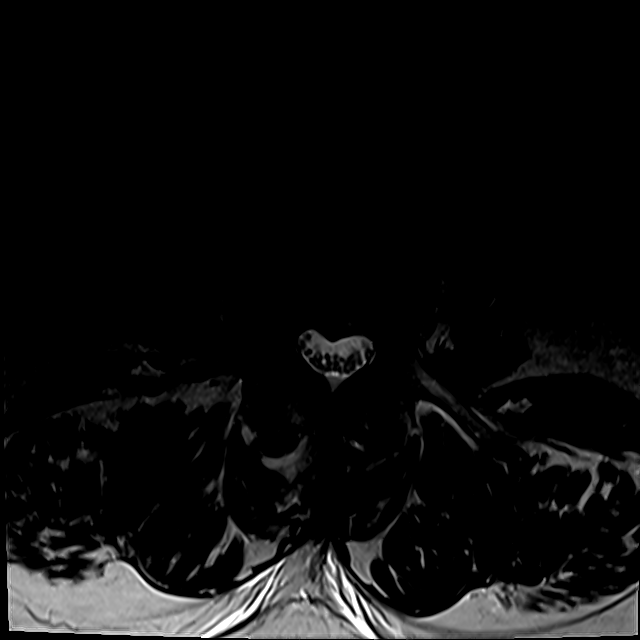
[im 5/19]
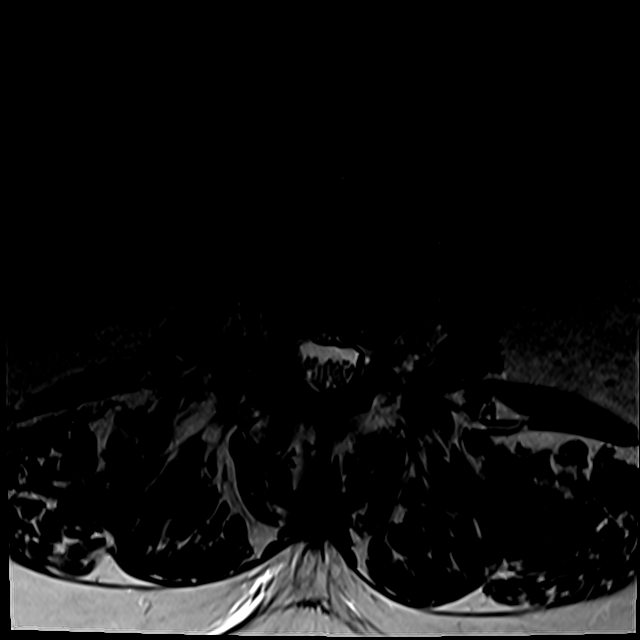
[im 10/19]
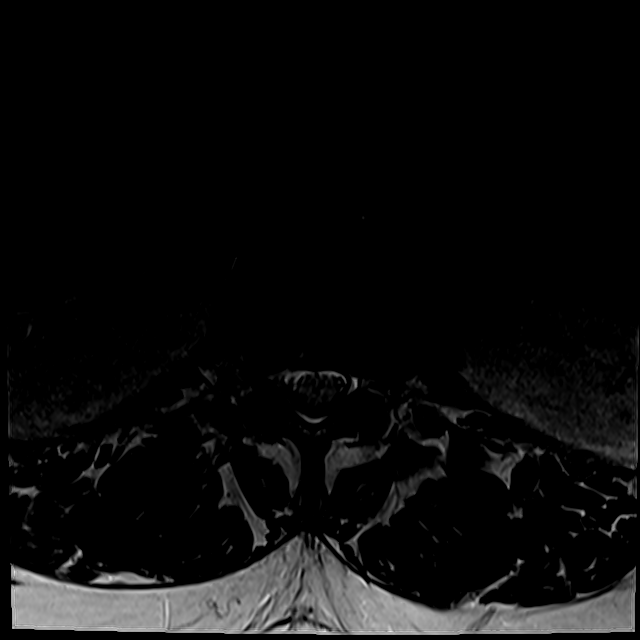
[im 14/19]
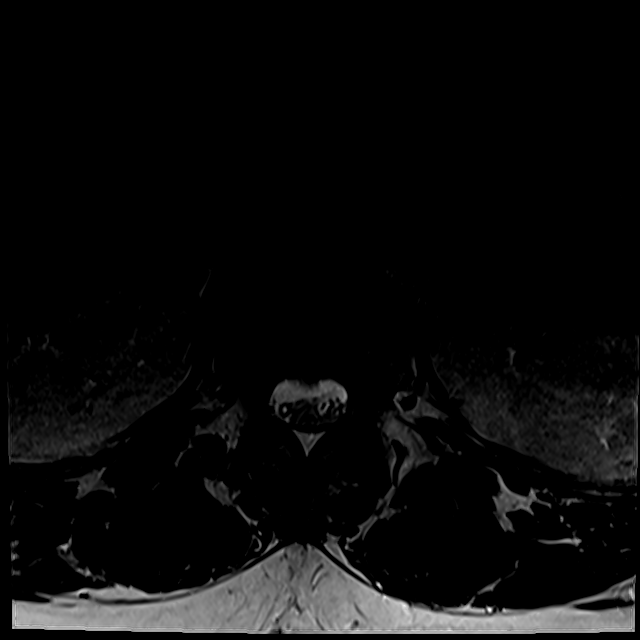
[im 19/19]
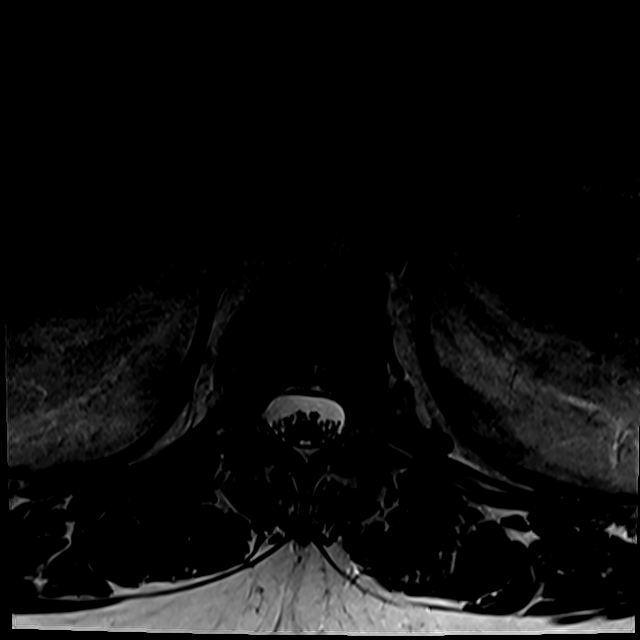

[Series 11: T2 · axial · 4.0mm · 0.28mm/px · z∈[-80,+13]mm · 3 of 20 slices shown (3 of 4)]
[im 1/20]
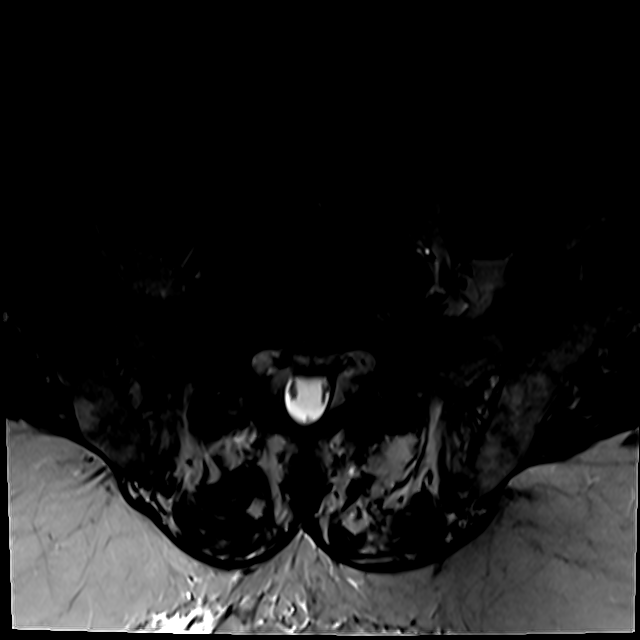
[im 10/20]
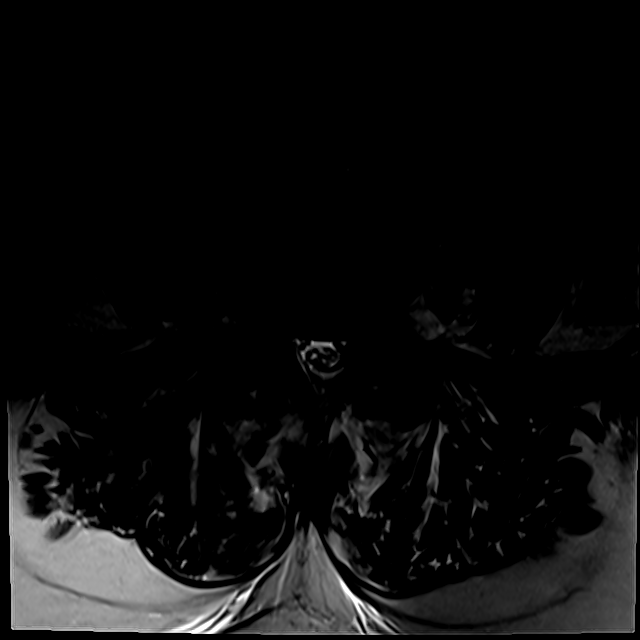
[im 20/20]
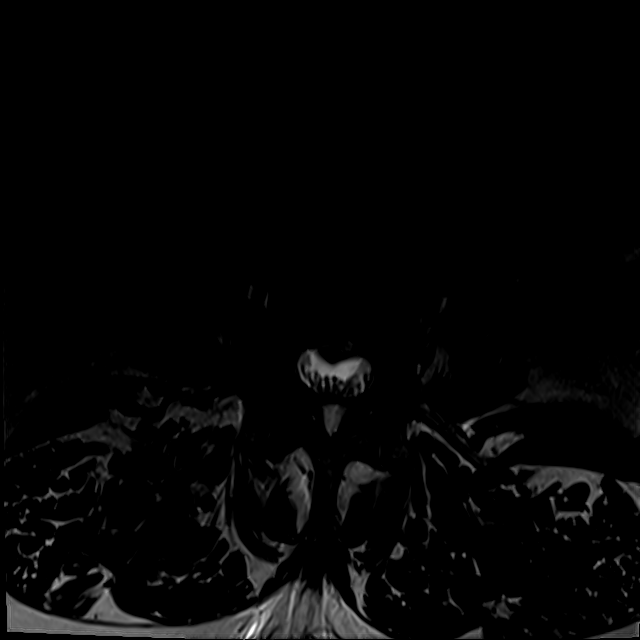

[Series 101: T2 · axial · 4.0mm · 0.28mm/px · z∈[-60,+113]mm · 3 of 39 slices shown (4 of 4)]
[im 5/39]
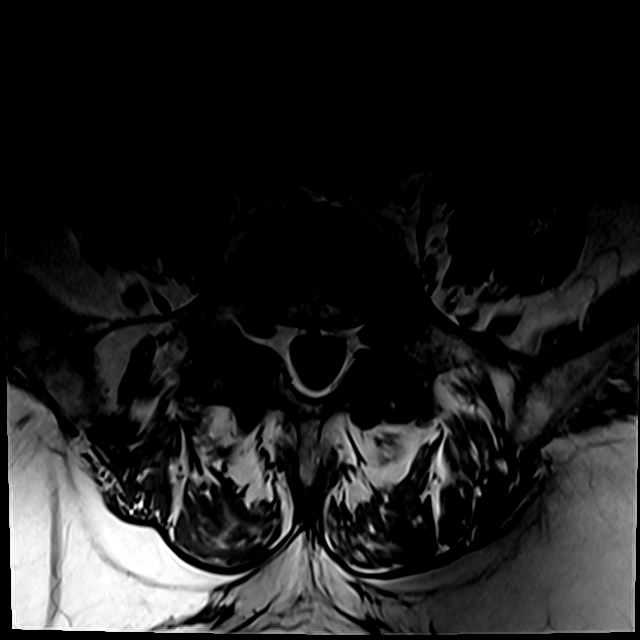
[im 20/39]
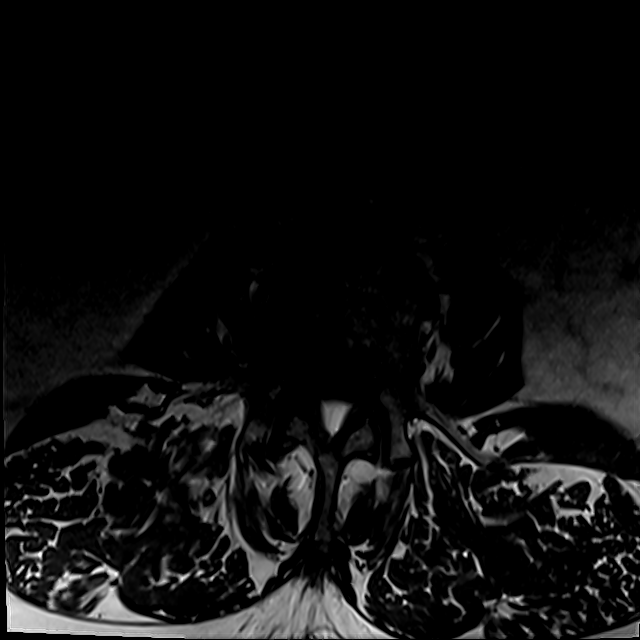
[im 34/39]
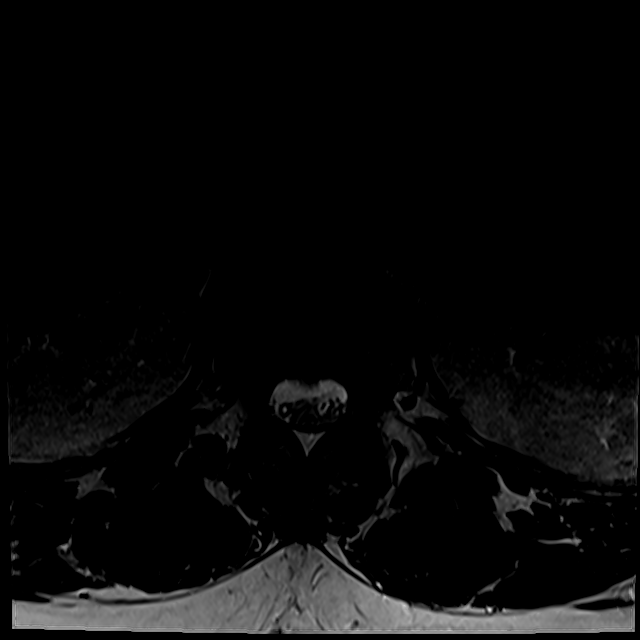

[14 of 48 positions shown; findings below may reference images not displayed]

FINDINGS: Segmentation: There are five lumbar type vertebral bodies. The last
full intervertebral disc space is labeled L5-S1.

Alignment: Normal overall alignment. Mild multilevel degenerative
listhesis.

Vertebrae: Degenerative endplate reactive type changes but no bone
lesions or fractures.

Conus medullaris and cauda equina: Conus extends to the T12-L1
level. Conus and cauda equina appear normal.

Paraspinal and other soft tissues: No significant paraspinal or
retroperitoneal findings. Incidental circumaortic left renal vein.

Disc levels:

T12-L1: Diffuse annular bulge with mild flattening of the ventral
thecal sac but no significant spinal or foraminal stenosis.

L1-2: Bulging degenerated annulus, facet disease and ligamentum
flavum thickening contributing to mild spinal and bilateral lateral
recess stenosis. There is also mild left foraminal stenosis.

L2-3: Bulging degenerated annulus, short pedicles and facet disease
contributing to mild spinal and moderate bilateral lateral recess
stenosis, left greater than right. No significant foraminal
stenosis.

L3-4: Bulging degenerated annulus, facet disease and ligamentum
flavum thickening contributing to moderate spinal and bilateral
lateral recess stenosis. There is also mild bilateral foraminal
stenosis, right greater than left.

L4-5: Bulging uncovered disc, severe facet disease and ligamentum
flavum thickening contributing to moderate spinal and bilateral
lateral recess stenosis. There is also mild right foraminal
stenosis.

L5-S1: Severe facet disease but no disc protrusions, spinal or
foraminal stenosis.
IMPRESSION: 1. Degenerative lumbar spondylosis with multilevel disc disease and
facet disease.
2. Multilevel multifactorial spinal, lateral recess and foraminal
stenosis as discussed above at the individual levels. The most
significant levels are L3-4 and L4-5.

## 2022-11-13 HISTORY — PX: TOTAL HIP ARTHROPLASTY: SHX124

## 2022-12-04 NOTE — Procedures (Signed)
(  Right) TOTAL HIP ARTHROPLASTY (Hip)  Procedure Note    Kyle Washington 12/04/2022   Pre-op Diagnosis: Primary osteoarthritis of right hip [M16.11] Trochanteric bursitis of right hip [M70.61] Right hip pain [M25.551]     Post-op Diagnosis:  Post-Op Diagnosis Codes:    * Primary osteoarthritis of right hip [M16.11]    * Trochanteric bursitis of right hip [M70.61]    * Right hip pain [M25.551]  Procedure(s): TOTAL HIP ARTHROPLASTY (Right)   SURGEON: Surgeon(s) and Role:    DEWAINE Taft Jayson Alm, MD - Primary    * Hardaker, Elsie Prescott, MD - Resident - Assisting  Assistant(s):   Anesthesia: * No anesthesia type entered *  Staff:   Circulator: Derick Doffing, RN Scrub Person: Pandora Pierce  Estimated Blood Loss: 300 mL   * No values recorded between 12/04/2022 10:53 AM and 12/04/2022  2:13 PM *             Specimens:  Order Name Source Comment Collection Info Order Time  TYPE AND SCREEN   Collected By: Perley Knee, RN 12/04/2022  8:22 AM    Release to patient   Immediate                Drains:  Closed/Suction Drain Autotransfusion Lateral;Right Back 10 Fr. (Active)        ELSIE PRESCOTT [facility]   Date: 12/04/2022  Time: 2:17 PM

## 2022-12-05 NOTE — Procedures (Signed)
 Operative Note   SURGERY DATE: 12/04/2022  PRE-OP DIAGNOSIS: Primary osteoarthritis of right hip [M16.11]   POST-OP DIAGNOSIS: Primary osteoarthritis of right hip [M16.11]   Procedure(s): TOTAL HIP ARTHROPLASTY (Right)    SURGEON: Surgeon(s) and Role:    DEWAINE Taft Jayson Alm, MD - Primary    * Hardaker, Elsie Prescott, MD - Resident - Assisting  ASSISTANT(S):   STAFF: Circulator: Sichitiu, Leita, RN Scrub Person: Pandora Pierce  ANESTHESIA: GENERAL  INDICATION(S): Kyle Washington is a 72 y.o. male who presents with end stage right hip DJD. He has exhausted non-operative treatment including physical therapy and injections. He is here today for right total hip arthroplasty. The surgical procedure and the expected postoperative course were discussed with the patient. The risks and benefits of the surgical procedure were also discussed with the patient in detail.   OPERATIVE FINDINGS: See below   OPERATIVE REPORT:  Following informed consent and administration of IV antibiotics, the patient was brought back to the operating room and following administration of anesthesia was placed in the lateral position with theoperative hip up. The operative hip and lower extremity were prescrubbed and subsequently prepped and draped in normal sterile fashion. A standard posterior approach was performed. Subcutaneous tissues were incised in line with the skin incision. The deep fascia layer was identified and incised in line with the skin incision.  Fibers of gluteus maximus were bluntly dissected proximally. The underlying greater trochanter, short external rotators, and sciatic nerve were identified. Care was taken not to injure the sciatic nerve during this surgical procedure. Short external rotators and capsule were taken down from the posterior femoral attachment site and tagged. The hip was dislocated. The femoral neck was cut 1 fingerbreadth above the lesser trochanter.    The anterior  acetabular retractor was used to retract the proximal femur anteriorly. The floor of the fovea was cleared. The transverse ligament was incised. The labrum was resected. Once adequate exposure was obtained, the acetabulum was reamed to the floor of the fovea. It was subsequently reamed in line with normal acetabular version up to a reamer 1 mm less than the implanted cup. There was excellent bleeding  cancellous bone at this point. A trial cup was pressfit into position with good fit and stability. This was subsequently removed. The acetabulum was copiously irrigated with normal saline containing bacitracin. The implanted acetabular cup was opened and then impacted into position. A trial liner was placed.    Attention was turned to the femur. Bone on the lateral aspect of the neck was removed using the box cutting osteotome. The canal finder was introduced, followed by the lateralizer. The canal was then sequentially reamed up to a size corresponding to the final implant.  This reamer provided good distal interference fit. It was then sequentially broached up to the size corresponding to the final implant.  This provided good proximal interference fit. Trial reduction with various size head and neck combinations were performed with the final implants selected to closely approximate contralateral leg length while also providing good range of motion and stability.  Good stability was achieved with flexion to 90 degrees and internal rotation to 45 degrees without dislocation, full extension and external rotation without dislocation, and minimal toggle.   The hip was dislocated. The trial components were removed. The acetabular shell was secured with dome screw(s). The wound was copiously irrigated. The liner was impacted into position. Osteophytes around the rim were removed. The femoral shaft was copiously irrigated with normal saline containing  bacitracin. The femoral stem was then impacted into position. The  head and neck was impacted onto the stem. The hip was relocated and taken through range of motion with good motion and stability again noted.   The wound was  copiously irrigated. Short external rotators and capsule were repaired to the posterior aspect of the greater trochanter through drill holes with number 2 FiberWire. The wound was again copiously irrigated. The deep fascia layer was reapproximated with figure of eight sutures of 0 Vicryl. Subcutaneous tissues were reapproximated using interrupted inverted sutures of 2.0 Biosyn. The skin was closed with 2.0 vertical mattress nylon. The wound was cleaned, dried, and dressed with Aquacel dressing. An abduction pillow was placed.       ESTIMATED BLOOD LOSS: 300 mL     Closed/Suction Drain Autotransfusion Lateral;Right Back 10 Fr. (Active)    TOTAL IV FLUIDS: per anesthesia  SPECIMENS:  * No specimens in log *   IMPLANTS:  Implant Name Type Inv. Item Serial No. Manufacturer Lot No. LRB No. Used Action  CUP, ACETAB PINN SECTOR II GRIPTION 54MM - [id number]  CUP, ACETAB BETTINA LEARN II GRIPTION  [employer]PU[employer]TH[employer] [id number] Right 1 Implanted  SCREW, BONE CANCELLOUS PINN 6.5X35MM - [id number]  SCREW, BONE CANCELLOUS PINN 6.5X35MM  [employer]TH[employer] [id number] Right 1 Implanted  SCREW, BONE CANCELLOUS PINN 6.5X15MM - [id number]  SCREW, BONE CANCELLOUS PINN 6.5X15MM  [employer]PU[employer] ORTH[employer] [id number] Right 1 Implanted  LINER, ACETAB PINN ALTRX NEU [id number] - [id number]  LINER, ACETAB PINN ALTRX NEU [id number]  J[employer]/DEPUY ORTHOPA[employer]IC [id number] Right 1 Implanted  STEM, FEM SUMMIT 12/14 OFFST HI CTD 5 - [id number]  STEM, FEM SUMMIT 12/14 OFFST HI CTD 5  J[employer]/DEPUY ORTHOP[employer]IC [id number] Right 1 Implanted  HEAD, FEM ARTICUL/EZE M-SPEC COCR 36-2 - [id number]  HEAD, FEM ARTICUL/EZE M-SPEC COCR 36-2  [employer]/DEPU[employer] ORTH[employer] [id number] Right 1 Implanted     COMPLICATIONS: None   DISPOSITION: PACU - hemodynamically stable.  ATTESTATION:  FR-  Surgery - (Entire) - I was present throughout the entire procedure.  I have reviewed and agree with the procedural note as documented by the resident (FR).

## 2023-03-14 DIAGNOSIS — U071 COVID-19: Secondary | ICD-10-CM

## 2023-03-14 HISTORY — DX: COVID-19: U07.1

## 2023-07-28 ENCOUNTER — Encounter: Payer: Self-pay | Admitting: Ophthalmology

## 2023-07-29 NOTE — Anesthesia Preprocedure Evaluation (Addendum)
Anesthesia Evaluation  Patient identified by MRN, date of birth, ID band Patient awake    Reviewed: Allergy & Precautions, NPO status , Patient's Chart, lab work & pertinent test results  History of Anesthesia Complications Negative for: history of anesthetic complications  Airway Mallampati: III  TM Distance: >3 FB Neck ROM: full    Dental  (+) Chipped, Dental Advidsory Given   Pulmonary sleep apnea and Continuous Positive Airway Pressure Ventilation    Pulmonary exam normal        Cardiovascular hypertension, negative cardio ROS Normal cardiovascular exam     Neuro/Psych negative neurological ROS  negative psych ROS   GI/Hepatic Neg liver ROS,GERD  Medicated,,  Endo/Other  negative endocrine ROSdiabetes    Renal/GU      Musculoskeletal   Abdominal   Peds  Hematology negative hematology ROS (+)   Anesthesia Other Findings Past Medical History: No date: Arthritis No date: Barrett's esophagus 03/2023: COVID-19     Comment:  Resolved No date: Hypertension No date: Hypogonadism, male No date: Sleep apnea     Comment:  CPAP No date: Type 2 diabetes mellitus (HCC) No date: Vertigo     Comment:  several years ago No date: Wears hearing aid in both ears  Past Surgical History: 09/2021: BACK SURGERY 11/2022: TOTAL HIP ARTHROPLASTY; Right  BMI    Body Mass Index: 38.63 kg/m      Reproductive/Obstetrics negative OB ROS                             Anesthesia Physical Anesthesia Plan  ASA: 2  Anesthesia Plan: General and MAC   Post-op Pain Management: Minimal or no pain anticipated   Induction: Intravenous  PONV Risk Score and Plan: 3 and Propofol infusion, TIVA and Ondansetron  Airway Management Planned: Nasal Cannula  Additional Equipment: None  Intra-op Plan:   Post-operative Plan:   Informed Consent: I have reviewed the patients History and Physical, chart, labs  and discussed the procedure including the risks, benefits and alternatives for the proposed anesthesia with the patient or authorized representative who has indicated his/her understanding and acceptance.     Dental advisory given  Plan Discussed with: CRNA and Surgeon  Anesthesia Plan Comments: (Discussed risks of anesthesia with patient, including possibility of difficulty with spontaneous ventilation under anesthesia necessitating airway intervention, PONV, and rare risks such as cardiac or respiratory or neurological events, and allergic reactions. Discussed the role of CRNA in patient's perioperative care. Patient understands.)        Anesthesia Quick Evaluation

## 2023-07-30 NOTE — Discharge Instructions (Signed)

## 2023-08-04 ENCOUNTER — Ambulatory Visit: Payer: Medicare Other | Admitting: Anesthesiology

## 2023-08-04 ENCOUNTER — Other Ambulatory Visit: Payer: Self-pay

## 2023-08-04 ENCOUNTER — Ambulatory Visit
Admission: RE | Admit: 2023-08-04 | Discharge: 2023-08-04 | Disposition: A | Discharge status: Home / Self Care | Payer: Medicare Other | Attending: Ophthalmology | Admitting: Ophthalmology

## 2023-08-04 ENCOUNTER — Encounter: Payer: Self-pay | Admitting: Ophthalmology

## 2023-08-04 ENCOUNTER — Encounter
Admission: RE | Disposition: A | Discharge status: Home / Self Care | Payer: Self-pay | Source: Home / Self Care | Attending: Ophthalmology

## 2023-08-04 DIAGNOSIS — G4730 Sleep apnea, unspecified: Secondary | ICD-10-CM | POA: Insufficient documentation

## 2023-08-04 DIAGNOSIS — H2511 Age-related nuclear cataract, right eye: Secondary | ICD-10-CM | POA: Insufficient documentation

## 2023-08-04 DIAGNOSIS — Z8616 Personal history of COVID-19: Secondary | ICD-10-CM | POA: Insufficient documentation

## 2023-08-04 DIAGNOSIS — K219 Gastro-esophageal reflux disease without esophagitis: Secondary | ICD-10-CM | POA: Diagnosis not present

## 2023-08-04 DIAGNOSIS — Z96641 Presence of right artificial hip joint: Secondary | ICD-10-CM | POA: Diagnosis not present

## 2023-08-04 DIAGNOSIS — Z7984 Long term (current) use of oral hypoglycemic drugs: Secondary | ICD-10-CM | POA: Insufficient documentation

## 2023-08-04 DIAGNOSIS — E1136 Type 2 diabetes mellitus with diabetic cataract: Secondary | ICD-10-CM | POA: Insufficient documentation

## 2023-08-04 DIAGNOSIS — Z7985 Long-term (current) use of injectable non-insulin antidiabetic drugs: Secondary | ICD-10-CM | POA: Insufficient documentation

## 2023-08-04 DIAGNOSIS — M1990 Unspecified osteoarthritis, unspecified site: Secondary | ICD-10-CM | POA: Insufficient documentation

## 2023-08-04 DIAGNOSIS — I10 Essential (primary) hypertension: Secondary | ICD-10-CM | POA: Insufficient documentation

## 2023-08-04 HISTORY — PX: CATARACT EXTRACTION W/PHACO: SHX586

## 2023-08-04 HISTORY — DX: Sleep apnea, unspecified: G47.30

## 2023-08-04 HISTORY — DX: Unspecified osteoarthritis, unspecified site: M19.90

## 2023-08-04 HISTORY — DX: Dizziness and giddiness: R42

## 2023-08-04 HISTORY — DX: Presence of external hearing-aid: Z97.4

## 2023-08-04 HISTORY — DX: Type 2 diabetes mellitus without complications: E11.9

## 2023-08-04 HISTORY — DX: Barrett's esophagus without dysplasia: K22.70

## 2023-08-04 HISTORY — DX: Essential (primary) hypertension: I10

## 2023-08-04 HISTORY — DX: Testicular hypofunction: E29.1

## 2023-08-04 LAB — GLUCOSE, CAPILLARY: Glucose-Capillary: 104 mg/dL — ABNORMAL HIGH (ref 70–99)

## 2023-08-04 SURGERY — PHACOEMULSIFICATION, CATARACT, WITH IOL INSERTION
Anesthesia: Monitor Anesthesia Care | Site: Eye | Laterality: Right

## 2023-08-04 MED ORDER — SODIUM CHLORIDE 0.9% FLUSH: 10.0000 mL | Freq: Two times a day (BID) | INTRAVENOUS | Status: DC

## 2023-08-04 MED ORDER — ARMC OPHTHALMIC DILATING DROPS
1.0000 | OPHTHALMIC | Status: DC | PRN
  Administered 2023-08-04 (×3): 1 via OPHTHALMIC

## 2023-08-04 MED ORDER — LACTATED RINGERS IV SOLN: INTRAVENOUS | Status: DC

## 2023-08-04 MED ORDER — TETRACAINE HCL 0.5 % OP SOLN
OPHTHALMIC | Status: AC
  Filled 2023-08-04: qty 4

## 2023-08-04 MED ORDER — ARMC OPHTHALMIC DILATING DROPS
OPHTHALMIC | Status: AC
  Filled 2023-08-04: qty 0.5

## 2023-08-04 MED ORDER — TETRACAINE HCL 0.5 % OP SOLN
1.0000 [drp] | OPHTHALMIC | Status: DC | PRN
  Administered 2023-08-04 (×3): 1 [drp] via OPHTHALMIC

## 2023-08-04 MED ORDER — MOXIFLOXACIN HCL 0.5 % OP SOLN
OPHTHALMIC | Status: DC | PRN
  Administered 2023-08-04: .2 mL via OPHTHALMIC

## 2023-08-04 MED ORDER — SIGHTPATH DOSE#1 BSS IO SOLN
INTRAOCULAR | Status: DC | PRN
  Administered 2023-08-04: 53 mL via OPHTHALMIC

## 2023-08-04 MED ORDER — FENTANYL CITRATE (PF) 100 MCG/2ML IJ SOLN
INTRAMUSCULAR | Status: AC
  Filled 2023-08-04: qty 2

## 2023-08-04 MED ORDER — SIGHTPATH DOSE#1 BSS IO SOLN
INTRAOCULAR | Status: DC | PRN
  Administered 2023-08-04: 15 mL via INTRAOCULAR

## 2023-08-04 MED ORDER — SIGHTPATH DOSE#1 BSS IO SOLN
INTRAOCULAR | Status: DC | PRN
  Administered 2023-08-04: 2 mL

## 2023-08-04 MED ORDER — MIDAZOLAM HCL 2 MG/2ML IJ SOLN
INTRAMUSCULAR | Status: DC | PRN
  Administered 2023-08-04: 2 mg via INTRAVENOUS

## 2023-08-04 MED ORDER — SIGHTPATH DOSE#1 NA CHONDROIT SULF-NA HYALURON 40-17 MG/ML IO SOLN
INTRAOCULAR | Status: DC | PRN
  Administered 2023-08-04: 1 mL via INTRAOCULAR

## 2023-08-04 MED ORDER — BRIMONIDINE TARTRATE-TIMOLOL 0.2-0.5 % OP SOLN
OPHTHALMIC | Status: DC | PRN
  Administered 2023-08-04: 1 [drp] via OPHTHALMIC

## 2023-08-04 MED ORDER — MIDAZOLAM HCL 2 MG/2ML IJ SOLN
INTRAMUSCULAR | Status: AC
  Filled 2023-08-04: qty 2

## 2023-08-04 SURGICAL SUPPLY — 12 items
ANGLE REVERSE CUT SHRT 25GA (CUTTER) ×1
CANNULA ANT/CHMB 27G (MISCELLANEOUS) IMPLANT
CANNULA ANT/CHMB 27GA (MISCELLANEOUS)
CATARACT SUITE SIGHTPATH (MISCELLANEOUS) ×1
CYSTOTOME ANGL RVRS SHRT 25G (CUTTER) ×1 IMPLANT
FEE CATARACT SUITE SIGHTPATH (MISCELLANEOUS) ×1 IMPLANT
GLOVE BIOGEL PI IND STRL 8 (GLOVE) ×1 IMPLANT
GLOVE SURG LX STRL 8.0 MICRO (GLOVE) ×1 IMPLANT
LENS IOL TECNIS EYHANCE 23.0 (Intraocular Lens) IMPLANT
NDL FILTER BLUNT 18X1 1/2 (NEEDLE) ×1 IMPLANT
NEEDLE FILTER BLUNT 18X1 1/2 (NEEDLE) ×1
SYR 3ML LL SCALE MARK (SYRINGE) ×1 IMPLANT

## 2023-08-04 NOTE — H&P (Signed)
Kyle Washington   Primary Care Physician:  Lauro Regulus, MD Ophthalmologist: Dr. Druscilla Brownie  Pre-Procedure History & Physical: HPI:  Kyle Washington is a 72 y.o. male here for cataract surgery.   Past Medical History:  Diagnosis Date   Arthritis    Barrett's esophagus    COVID-19 03/2023   Resolved   Hypertension    Hypogonadism, male    Sleep apnea    CPAP   Type 2 diabetes mellitus (HCC)    Vertigo    several years ago   Wears hearing aid in both ears     Past Surgical History:  Procedure Laterality Date   BACK SURGERY  09/2021   TOTAL HIP ARTHROPLASTY Right 11/2022    Prior to Admission medications   Medication Sig Start Date End Date Taking? Authorizing Provider  acetaminophen (TYLENOL) 500 MG tablet Take 1,000 mg by mouth every 6 (six) hours as needed.   Yes [provider]  amLODipine (NORVASC) 5 MG tablet Take 5 mg by mouth daily. 03/24/22  Yes [provider]  atorvastatin (LIPITOR) 20 MG tablet Take 20 mg by mouth daily. 03/14/22  Yes [provider]  Cholecalciferol (VITAMIN D-1000 MAX ST) 25 MCG (1000 UT) tablet Take 1,000 Units by mouth daily.   Yes [provider]  CHONDROITIN SULFATE PO Take 1,200 mg by mouth daily.   Yes [provider]  escitalopram (LEXAPRO) 20 MG tablet Take 20 mg by mouth daily. 02/06/22  Yes [provider]  Glucosamine-Chondroit-Vit C-Mn (GLUCOSAMINE 1500 COMPLEX PO) Take by mouth daily.   Yes [provider]  losartan (COZAAR) 100 MG tablet Take 100 mg by mouth daily. 03/02/22  Yes [provider]  MAGNESIUM CITRATE PO Take 500 mg by mouth at bedtime.   Yes [provider]  metformin (FORTAMET) 500 MG (OSM) 24 hr tablet Take 1,000 mg by mouth 2 (two) times daily with a meal.   Yes [provider]  METHYLCOBALAMIN PO Take 500 mcg by mouth daily.   Yes [provider]  Multiple Vitamins-Minerals (ONE-A-DAY MENS 50+ ADVANTAGE) TABS Take  1 tablet by mouth daily.   Yes [provider]  Omega-3 Fatty Acids (FISH OIL) 1000 MG CAPS Take 1 capsule by mouth daily.   Yes [provider]  omeprazole (PRILOSEC) 20 MG capsule Take 20 mg by mouth daily. 03/02/22  Yes [provider]  OVER THE COUNTER MEDICATION daily. Balance of Nature Fruits & Vegetables   Yes [provider]  OZEMPIC, 0.25 OR 0.5 MG/DOSE, 2 MG/3ML SOPN Inject 0.75 mg into the skin once a week. 03/20/22  Yes [provider]  pregabalin (LYRICA) 50 MG capsule Take 50 mg by mouth 2 (two) times daily.   Yes [provider]  testosterone cypionate (DEPOTESTOSTERONE CYPIONATE) 200 MG/ML injection SMARTSIG:0.45 Milliliter(s) IM Every 2 Weeks 04/02/22  Yes [provider]  traZODone (DESYREL) 50 MG tablet Take 50-100 mg by mouth at bedtime as needed. 04/19/22  Yes [provider]    Allergies as of 07/07/2023   (No Known Allergies)    History reviewed. No pertinent family history.  Social History   Socioeconomic History   Marital status: Married    Spouse name: Not on file   Number of children: Not on file   Years of education: Not on file   Highest education level: Not on file  Occupational History   Not on file  Tobacco Use   Smoking status: Never   Smokeless  tobacco: Never  Vaping Use   Vaping status: Never Used  Substance and Sexual Activity   Alcohol use: Yes    Alcohol/week: 2.0 standard drinks of alcohol    Types: 2 Standard drinks or equivalent per week   Drug use: Not on file   Sexual activity: Not on file  Other Topics Concern   Not on file  Social History Narrative   Not on file   Social Determinants of Health   Financial Resource Strain: Low Risk  (12/05/2022)   Received from [facility], [facility]   Overall Financial Resource Strain (CARDIA)    Difficulty of Paying Living Expenses: Not hard at all  Food Insecurity: No Food Insecurity  (12/05/2022)   Received from [facility], [facility]   Hunger Vital Sign    Worried About Running Out of Food in the Last Year: Never true    Ran Out of Food in the Last Year: Never true  Transportation Needs: No Transportation Needs (12/05/2022)   Received from [facility], [facility]   [facility] - Transportation    In the past 12 months, has lack of transportation kept you from medical appointments or from getting medications?: No    Lack of Transportation (Non-Medical): No  Physical Activity: Not on file  Stress: Not on file  Social Connections: Not on file  Intimate Partner Violence: Not on file    Review of Systems: See HPI, otherwise negative ROS  Physical Exam: BP 125/68   Pulse 71   Temp (!) 97.3 F (36.3 C) (Temporal)   Resp 12   Ht 5\' 11"  (1.803 m)   Wt 125.6 kg   SpO2 94%   BMI 38.63 kg/m  General:   Alert, cooperative in NAD Head:  Normocephalic and atraumatic. Respiratory:  Normal work of breathing. Cardiovascular:  RRR  Impression/Plan: Kyle Washington is here for cataract surgery.  Risks, benefits, limitations, and alternatives regarding cataract surgery have been reviewed with the patient.  Questions have been answered.  All parties agreeable.   Galen Manila, MD  08/04/2023, 7:50 AM

## 2023-08-04 NOTE — Op Note (Signed)
PREOPERATIVE DIAGNOSIS:  Nuclear sclerotic cataract of the right eye.   POSTOPERATIVE DIAGNOSIS:  Cataract   OPERATIVE PROCEDURE:ORPROCALL@   SURGEON:  Galen Manila, MD.   ANESTHESIA:  Anesthesiologist: Stephanie Coup, MD CRNA: Emeterio Reeve, CRNA  1.      Managed anesthesia care. 2.      0.74ml of Shugarcaine was instilled in the eye following the paracentesis.   COMPLICATIONS:  None.   TECHNIQUE:   Stop and chop   DESCRIPTION OF PROCEDURE:  The patient was examined and consented in the preoperative holding area where the aforementioned topical anesthesia was applied to the right eye and then brought back to the Operating Room where the right eye was prepped and draped in the usual sterile ophthalmic fashion and a lid speculum was placed. A paracentesis was created with the side port blade and the anterior chamber was filled with viscoelastic. A near clear corneal incision was performed with the steel keratome. A continuous curvilinear capsulorrhexis was performed with a cystotome followed by the capsulorrhexis forceps. Hydrodissection and hydrodelineation were carried out with BSS on a blunt cannula. The lens was removed in a stop and chop  technique and the remaining cortical material was removed with the irrigation-aspiration handpiece. The capsular bag was inflated with viscoelastic and the Technis ZCB00  lens was placed in the capsular bag without complication. The remaining viscoelastic was removed from the eye with the irrigation-aspiration handpiece. The wounds were hydrated. The anterior chamber was flushed with BSS and the eye was inflated to physiologic pressure. 0.68ml of Vigamox was placed in the anterior chamber. The wounds were found to be water tight. The eye was dressed with Combigan. The patient was given protective glasses to wear throughout the day and a shield with which to sleep tonight. The patient was also given drops with which to begin a drop regimen today and  will follow-up with me in one day. Implant Name Type Inv. Item Serial No. Manufacturer Lot No. LRB No. Used Action  LENS IOL TECNIS EYHANCE 23.0 - [id number] Intraocular Lens LENS IOL TECNIS EYHANCE 23.0 [id number] [employer]  Right 1 Implanted   Procedure(s): CATARACT EXTRACTION PHACO AND INTRAOCULAR LENS PLACEMENT (IOC) RIGHT DIABETIC 9.90 00:50.3 (Right)  Electronically signed: Galen Manila 08/04/2023 8:15 AM

## 2023-08-04 NOTE — Transfer of Care (Signed)
Immediate Anesthesia Transfer of Care Note  Patient: Kyle Washington  Procedure(s) Performed: CATARACT EXTRACTION PHACO AND INTRAOCULAR LENS PLACEMENT (IOC) RIGHT DIABETIC 9.90 00:50.3 (Right: Eye)  Patient Location: PACU  Anesthesia Type: General, MAC  Level of Consciousness: awake, alert  and patient cooperative  Airway and Oxygen Therapy: Patient Spontanous Breathing and Patient connected to supplemental oxygen  Post-op Assessment: Post-op Vital signs reviewed, Patient's Cardiovascular Status Stable, Respiratory Function Stable, Patent Airway and No signs of Nausea or vomiting  Post-op Vital Signs: Reviewed and stable  Complications: No notable events documented.

## 2023-08-04 NOTE — Anesthesia Postprocedure Evaluation (Signed)
Anesthesia Post Note  Patient: Kyle Washington  Procedure(s) Performed: CATARACT EXTRACTION PHACO AND INTRAOCULAR LENS PLACEMENT (IOC) RIGHT DIABETIC 9.90 00:50.3 (Right: Eye)  Patient location during evaluation: PACU Anesthesia Type: MAC Level of consciousness: awake and alert Pain management: pain level controlled Vital Signs Assessment: post-procedure vital signs reviewed and stable Respiratory status: spontaneous breathing, nonlabored ventilation, respiratory function stable and patient connected to nasal cannula oxygen Cardiovascular status: stable and blood pressure returned to baseline Postop Assessment: no apparent nausea or vomiting Anesthetic complications: no  No notable events documented.   Last Vitals:  Vitals:   08/04/23 0817 08/04/23 0822  BP: 110/62 121/69  Pulse: 65 (!) 59  Resp: 19 10  Temp: 36.5 C 36.5 C  SpO2: 95% 95%    Last Pain:  Vitals:   08/04/23 0822  TempSrc:   PainSc: 0-No pain                 Stephanie Coup

## 2023-08-05 ENCOUNTER — Encounter: Payer: Self-pay | Admitting: Ophthalmology

## 2023-08-12 ENCOUNTER — Encounter: Payer: Self-pay | Admitting: Ophthalmology

## 2023-08-12 NOTE — Anesthesia Preprocedure Evaluation (Addendum)
Anesthesia Evaluation  Patient identified by MRN, date of birth, ID band Patient awake    Reviewed: Allergy & Precautions, H&P , NPO status , Patient's Chart, lab work & pertinent test results  Airway Mallampati: III  TM Distance: >3 FB Neck ROM: Full    Dental no notable dental hx.    Pulmonary sleep apnea    Pulmonary exam normal breath sounds clear to auscultation       Cardiovascular hypertension, Normal cardiovascular exam Rhythm:Regular Rate:Normal     Neuro/Psych negative neurological ROS  negative psych ROS   GI/Hepatic negative GI ROS, Neg liver ROS,,,  Endo/Other  diabetes    Renal/GU Renal disease  negative genitourinary   Musculoskeletal  (+) Arthritis ,    Abdominal   Peds negative pediatric ROS (+)  Hematology negative hematology ROS (+)   Anesthesia Other Findings   Hypertension  Type 2 diabetes mellitus Hypogonadism, male COVID-19 Barrett's esophagus  Sleep apnea on CPAP Arthritis  Vertigo Wears hearing aid in both ears    Previous cataract anesthesia Dr. Lorette Ang 10-22--24  Reproductive/Obstetrics negative OB ROS                             Anesthesia Physical Anesthesia Plan  ASA: 2  Anesthesia Plan: MAC   Post-op Pain Management:    Induction: Intravenous  PONV Risk Score and Plan:   Airway Management Planned: Natural Airway and Nasal Cannula  Additional Equipment:   Intra-op Plan:   Post-operative Plan:   Informed Consent: I have reviewed the patients History and Physical, chart, labs and discussed the procedure including the risks, benefits and alternatives for the proposed anesthesia with the patient or authorized representative who has indicated his/her understanding and acceptance.     Dental Advisory Given  Plan Discussed with: Anesthesiologist, CRNA and Surgeon  Anesthesia Plan Comments: (Patient consented for risks of anesthesia  including but not limited to:  - adverse reactions to medications - damage to eyes, teeth, lips or other oral mucosa - nerve damage due to positioning  - sore throat or hoarseness - Damage to heart, brain, nerves, lungs, other parts of body or loss of life  Patient voiced understanding and assent.)        Anesthesia Quick Evaluation

## 2023-08-14 NOTE — Discharge Instructions (Signed)

## 2023-08-17 ENCOUNTER — Ambulatory Visit
Admission: RE | Admit: 2023-08-17 | Discharge: 2023-08-17 | Disposition: A | Discharge status: Home / Self Care | Payer: Medicare Other | Attending: Ophthalmology | Admitting: Ophthalmology

## 2023-08-17 ENCOUNTER — Encounter: Payer: Self-pay | Admitting: Ophthalmology

## 2023-08-17 ENCOUNTER — Other Ambulatory Visit: Payer: Self-pay

## 2023-08-17 ENCOUNTER — Ambulatory Visit: Payer: Medicare Other | Admitting: Anesthesiology

## 2023-08-17 ENCOUNTER — Encounter
Admission: RE | Disposition: A | Discharge status: Home / Self Care | Payer: Self-pay | Source: Home / Self Care | Attending: Ophthalmology

## 2023-08-17 DIAGNOSIS — G4730 Sleep apnea, unspecified: Secondary | ICD-10-CM | POA: Diagnosis not present

## 2023-08-17 DIAGNOSIS — E1136 Type 2 diabetes mellitus with diabetic cataract: Secondary | ICD-10-CM | POA: Diagnosis present

## 2023-08-17 DIAGNOSIS — I10 Essential (primary) hypertension: Secondary | ICD-10-CM | POA: Diagnosis not present

## 2023-08-17 DIAGNOSIS — H2512 Age-related nuclear cataract, left eye: Secondary | ICD-10-CM | POA: Insufficient documentation

## 2023-08-17 DIAGNOSIS — M1990 Unspecified osteoarthritis, unspecified site: Secondary | ICD-10-CM | POA: Insufficient documentation

## 2023-08-17 DIAGNOSIS — Z7984 Long term (current) use of oral hypoglycemic drugs: Secondary | ICD-10-CM | POA: Diagnosis not present

## 2023-08-17 DIAGNOSIS — Z7985 Long-term (current) use of injectable non-insulin antidiabetic drugs: Secondary | ICD-10-CM | POA: Diagnosis not present

## 2023-08-17 HISTORY — DX: Obstructive sleep apnea (adult) (pediatric): G47.33

## 2023-08-17 HISTORY — PX: CATARACT EXTRACTION W/PHACO: SHX586

## 2023-08-17 LAB — GLUCOSE, CAPILLARY: Glucose-Capillary: 104 mg/dL — ABNORMAL HIGH (ref 70–99)

## 2023-08-17 SURGERY — PHACOEMULSIFICATION, CATARACT, WITH IOL INSERTION
Anesthesia: Monitor Anesthesia Care | Site: Eye | Laterality: Left

## 2023-08-17 MED ORDER — SIGHTPATH DOSE#1 BSS IO SOLN
INTRAOCULAR | Status: DC | PRN
  Administered 2023-08-17: 1 mL

## 2023-08-17 MED ORDER — MIDAZOLAM HCL 2 MG/2ML IJ SOLN
INTRAMUSCULAR | Status: AC
  Filled 2023-08-17: qty 2

## 2023-08-17 MED ORDER — SODIUM CHLORIDE 0.9% FLUSH: 10.0000 mL | Freq: Two times a day (BID) | INTRAVENOUS | Status: DC

## 2023-08-17 MED ORDER — BRIMONIDINE TARTRATE-TIMOLOL 0.2-0.5 % OP SOLN
OPHTHALMIC | Status: DC | PRN
  Administered 2023-08-17: 1 [drp] via OPHTHALMIC

## 2023-08-17 MED ORDER — FENTANYL CITRATE (PF) 100 MCG/2ML IJ SOLN
INTRAMUSCULAR | Status: AC
  Filled 2023-08-17: qty 2

## 2023-08-17 MED ORDER — SIGHTPATH DOSE#1 NA CHONDROIT SULF-NA HYALURON 40-17 MG/ML IO SOLN
INTRAOCULAR | Status: DC | PRN
  Administered 2023-08-17: 1 mL via INTRAOCULAR

## 2023-08-17 MED ORDER — TETRACAINE HCL 0.5 % OP SOLN
1.0000 [drp] | OPHTHALMIC | Status: DC | PRN
  Administered 2023-08-17 (×3): 1 [drp] via OPHTHALMIC

## 2023-08-17 MED ORDER — TETRACAINE HCL 0.5 % OP SOLN
OPHTHALMIC | Status: AC
  Filled 2023-08-17: qty 4

## 2023-08-17 MED ORDER — SIGHTPATH DOSE#1 BSS IO SOLN
INTRAOCULAR | Status: DC | PRN
  Administered 2023-08-17: 15 mL

## 2023-08-17 MED ORDER — MIDAZOLAM HCL 2 MG/2ML IJ SOLN
INTRAMUSCULAR | Status: DC | PRN
  Administered 2023-08-17: 2 mg via INTRAVENOUS

## 2023-08-17 MED ORDER — SIGHTPATH DOSE#1 BSS IO SOLN
INTRAOCULAR | Status: DC | PRN
  Administered 2023-08-17: 100 mL via OPHTHALMIC

## 2023-08-17 MED ORDER — ARMC OPHTHALMIC DILATING DROPS
1.0000 | OPHTHALMIC | Status: DC | PRN
  Administered 2023-08-17 (×3): 1 via OPHTHALMIC

## 2023-08-17 MED ORDER — MOXIFLOXACIN HCL 0.5 % OP SOLN
OPHTHALMIC | Status: DC | PRN
  Administered 2023-08-17: .2 mL via OPHTHALMIC

## 2023-08-17 MED ORDER — ARMC OPHTHALMIC DILATING DROPS
OPHTHALMIC | Status: AC
Start: 2023-08-17 — End: ?
  Filled 2023-08-17: qty 0.5

## 2023-08-17 MED ORDER — FENTANYL CITRATE (PF) 100 MCG/2ML IJ SOLN
INTRAMUSCULAR | Status: DC | PRN
  Administered 2023-08-17: 50 ug via INTRAVENOUS

## 2023-08-17 SURGICAL SUPPLY — 16 items
ANGLE REVERSE CUT SHRT 25GA (CUTTER) ×1
CANNULA ANT/CHMB 27G (MISCELLANEOUS) IMPLANT
CANNULA ANT/CHMB 27GA (MISCELLANEOUS)
CATARACT SUITE SIGHTPATH (MISCELLANEOUS) ×1
CYSTOTOME ANGL RVRS SHRT 25G (CUTTER) ×1 IMPLANT
FEE CATARACT SUITE SIGHTPATH (MISCELLANEOUS) ×1 IMPLANT
GLOVE BIOGEL PI IND STRL 8 (GLOVE) ×1 IMPLANT
GLOVE SURG LX STRL 8.0 MICRO (GLOVE) ×1 IMPLANT
LENS IOL TECNIS EYHANCE 23.0 (Intraocular Lens) IMPLANT
NDL FILTER BLUNT 18X1 1/2 (NEEDLE) ×1 IMPLANT
NEEDLE FILTER BLUNT 18X1 1/2 (NEEDLE) ×1
PACK VIT ANT 23G (MISCELLANEOUS) IMPLANT
RING MALYGIN (MISCELLANEOUS) IMPLANT
SUT ETHILON 10-0 CS-B-6CS-B-6 (SUTURE)
SUTURE EHLN 10-0 CS-B-6CS-B-6 (SUTURE) IMPLANT
SYR 3ML LL SCALE MARK (SYRINGE) ×1 IMPLANT

## 2023-08-17 NOTE — Transfer of Care (Signed)
Immediate Anesthesia Transfer of Care Note  Patient: Kyle Washington  Procedure(s) Performed: CATARACT EXTRACTION PHACO AND INTRAOCULAR LENS PLACEMENT (IOC) LEFT DIABETIC (Left: Eye)  Patient Location: PACU  Anesthesia Type: MAC  Level of Consciousness: awake, alert  and patient cooperative  Airway and Oxygen Therapy: Patient Spontanous Breathing and Patient connected to supplemental oxygen  Post-op Assessment: Post-op Vital signs reviewed, Patient's Cardiovascular Status Stable, Respiratory Function Stable, Patent Airway and No signs of Nausea or vomiting  Post-op Vital Signs: Reviewed and stable  Complications: No notable events documented.

## 2023-08-17 NOTE — Anesthesia Postprocedure Evaluation (Signed)
Anesthesia Post Note  Patient: Braydn Carneiro  Procedure(s) Performed: CATARACT EXTRACTION PHACO AND INTRAOCULAR LENS PLACEMENT (IOC) LEFT DIABETIC (Left: Eye)  Patient location during evaluation: PACU Anesthesia Type: MAC Level of consciousness: awake and alert Pain management: pain level controlled Vital Signs Assessment: post-procedure vital signs reviewed and stable Respiratory status: spontaneous breathing, nonlabored ventilation, respiratory function stable and patient connected to nasal cannula oxygen Cardiovascular status: stable and blood pressure returned to baseline Postop Assessment: no apparent nausea or vomiting Anesthetic complications: no   No notable events documented.   Last Vitals:  Vitals:   08/17/23 1110 08/17/23 1115  BP: 104/61 109/64  Pulse: 68 68  Resp: (!) 8 14  Temp:  (!) 36.3 C  SpO2: 98% 97%    Last Pain:  Vitals:   08/17/23 1115  TempSrc:   PainSc: 0-No pain                 Strummer Canipe C Vernica Wachtel

## 2023-08-17 NOTE — H&P (Signed)
[facility]   Primary Care Physician:  Lauro Regulus, MD Ophthalmologist: Dr. Druscilla Brownie  Pre-Procedure History & Physical: HPI:  Kyle Washington is a 72 y.o. male here for cataract surgery.   Past Medical History:  Diagnosis Date   Arthritis    Barrett's esophagus    COVID-19 03/2023   Resolved   Hypertension    Hypogonadism, male    OSA on CPAP    Sleep apnea    CPAP   Type 2 diabetes mellitus (HCC)    Vertigo    several years ago   Wears hearing aid in both ears     Past Surgical History:  Procedure Laterality Date   BACK SURGERY  09/2021   CATARACT EXTRACTION W/PHACO Right 08/04/2023   Procedure: CATARACT EXTRACTION PHACO AND INTRAOCULAR LENS PLACEMENT (IOC) RIGHT DIABETIC 9.90 00:50.3;  Surgeon: Galen Manila, MD;  Location: [facility];  Service: Ophthalmology;  Laterality: Right;   TOTAL HIP ARTHROPLASTY Right 11/2022    Prior to Admission medications   Medication Sig Start Date End Date Taking? Authorizing Provider  acetaminophen (TYLENOL) 500 MG tablet Take 1,000 mg by mouth every 6 (six) hours as needed.   Yes [provider]  amLODipine (NORVASC) 5 MG tablet Take 5 mg by mouth daily. 03/24/22  Yes [provider]  atorvastatin (LIPITOR) 20 MG tablet Take 20 mg by mouth daily. 03/14/22  Yes [provider]  Cholecalciferol (VITAMIN D-1000 MAX ST) 25 MCG (1000 UT) tablet Take 1,000 Units by mouth daily.   Yes [provider]  CHONDROITIN SULFATE PO Take 1,200 mg by mouth daily.   Yes [provider]  escitalopram (LEXAPRO) 20 MG tablet Take 20 mg by mouth daily. 02/06/22  Yes [provider]  Glucosamine-Chondroit-Vit C-Mn (GLUCOSAMINE 1500 COMPLEX PO) Take by mouth daily.   Yes [provider]  losartan (COZAAR) 100 MG tablet Take 100 mg by mouth daily. 03/02/22  Yes [provider]  MAGNESIUM CITRATE PO Take 500 mg by mouth at bedtime.   Yes [provider]   metformin (FORTAMET) 500 MG (OSM) 24 hr tablet Take 1,000 mg by mouth 2 (two) times daily with a meal.   Yes [provider]  METHYLCOBALAMIN PO Take 500 mcg by mouth daily.   Yes [provider]  Multiple Vitamins-Minerals (ONE-A-DAY MENS 50+ ADVANTAGE) TABS Take 1 tablet by mouth daily.   Yes [provider]  Omega-3 Fatty Acids (FISH OIL) 1000 MG CAPS Take 1 capsule by mouth daily.   Yes [provider]  omeprazole (PRILOSEC) 20 MG capsule Take 20 mg by mouth daily. 03/02/22  Yes [provider]  OVER THE COUNTER MEDICATION daily. Balance of Nature Fruits & Vegetables   Yes [provider]  OZEMPIC, 0.25 OR 0.5 MG/DOSE, 2 MG/3ML SOPN Inject 0.75 mg into the skin once a week. 03/20/22  Yes [provider]  pregabalin (LYRICA) 50 MG capsule Take 50 mg by mouth 2 (two) times daily.   Yes [provider]  traZODone (DESYREL) 50 MG tablet Take 50-100 mg by mouth at bedtime as needed. 04/19/22  Yes [provider]  testosterone cypionate (DEPOTESTOSTERONE CYPIONATE) 200 MG/ML injection SMARTSIG:0.45 Milliliter(s) IM Every 2 Weeks 04/02/22   [provider]    Allergies as of 07/07/2023   (No Known Allergies)    History reviewed. No pertinent family history.  Social History   Socioeconomic History   Marital status: Married    Spouse name: Not on  file   Number of children: Not on file   Years of education: Not on file   Highest education level: Not on file  Occupational History   Not on file  Tobacco Use   Smoking status: Never   Smokeless tobacco: Never  Vaping Use   Vaping status: Never Used  Substance and Sexual Activity   Alcohol use: Yes    Alcohol/week: 2.0 standard drinks of alcohol    Types: 2 Standard drinks or equivalent per week   Drug use: Not on file   Sexual activity: Not on file  Other Topics Concern   Not on file  Social History Narrative   Not on file   Social Determinants of  Health   Financial Resource Strain: Low Risk  (12/05/2022)   Received from [facility], [facility]   Overall Financial Resource Strain (CARDIA)    Difficulty of Paying Living Expenses: Not hard at all  Food Insecurity: No Food Insecurity (12/05/2022)   Received from [facility],  [facility]   Hunger Vital Sign    Worried About Running Out of Food in the Last Year: Never true    Ran Out of Food in the Last Year: Never true  Transportation Needs: No Transportation Needs (12/05/2022)   Received from [facility], [facility]   [facility] - Transportation    In the past 12 months, has lack of transportation kept you from medical appointments or from getting medications?: No    Lack of Transportation (Non-Medical): No  Physical Activity: Not on file  Stress: Not on file  Social Connections: Not on file  Intimate Partner Violence: Not on file    Review of Systems: See HPI, otherwise negative ROS  Physical Exam: BP 112/65   Pulse 70   Temp (!) 97.5 F (36.4 C) (Temporal)   Resp 18   Ht 5' 10.98" (1.803 m)   Wt 123.4 kg   SpO2 97%   BMI 37.95 kg/m  General:   Alert, cooperative in NAD Head:  Normocephalic and atraumatic. Respiratory:  Normal work of breathing. Cardiovascular:  RRR  Impression/Plan: Kyle Washington is here for cataract surgery.  Risks, benefits, limitations, and alternatives regarding cataract surgery have been reviewed with the patient.  Questions have been answered.  All parties agreeable.   Galen Manila, MD  08/17/2023, 10:42 AM

## 2023-08-17 NOTE — Op Note (Signed)
PREOPERATIVE DIAGNOSIS:  Nuclear sclerotic cataract of the left eye.   POSTOPERATIVE DIAGNOSIS:  Nuclear sclerotic cataract of the left eye.   OPERATIVE PROCEDURE:ORPROCALL@   SURGEON:  Galen Manila, MD.   ANESTHESIA:  Anesthesiologist: Marisue Humble, MD CRNA: Andee Poles, CRNA  1.      Managed anesthesia care. 2.     0.53ml of Shugarcaine was instilled following the paracentesis   COMPLICATIONS:  None.   TECHNIQUE:   Stop and chop   DESCRIPTION OF PROCEDURE:  The patient was examined and consented in the preoperative holding area where the aforementioned topical anesthesia was applied to the left eye and then brought back to the Operating Room where the left eye was prepped and draped in the usual sterile ophthalmic fashion and a lid speculum was placed. A paracentesis was created with the side port blade and the anterior chamber was filled with viscoelastic. A near clear corneal incision was performed with the steel keratome. A continuous curvilinear capsulorrhexis was performed with a cystotome followed by the capsulorrhexis forceps. Hydrodissection and hydrodelineation were carried out with BSS on a blunt cannula. The lens was removed in a stop and chop  technique and the remaining cortical material was removed with the irrigation-aspiration handpiece. The capsular bag was inflated with viscoelastic and the Technis [id number] lens was placed in the capsular bag without complication. The remaining viscoelastic was removed from the eye with the irrigation-aspiration handpiece. The wounds were hydrated. The anterior chamber was flushed with BSS and the eye was inflated to physiologic pressure. 0.34ml Vigamox was placed in the anterior chamber. The wounds were found to be water tight. The eye was dressed with Combigan. The patient was given protective glasses to wear throughout the day and a shield with which to sleep tonight. The patient was also given drops with which to begin a drop regimen  today and will follow-up with me in one day. Implant Name Type Inv. Item Serial No. Manufacturer Lot No. LRB No. Used Action  LENS IOL TECNIS EYHANCE 23.0 - [id number] Intraocular Lens LENS IOL TECNIS EYHANCE 23.0 [id number] [employer]  Left 1 Implanted    Procedure(s) with comments: CATARACT EXTRACTION PHACO AND INTRAOCULAR LENS PLACEMENT (IOC) LEFT DIABETIC (Left) - 7.18 0:57.3  Electronically signed: Galen Manila 08/17/2023 11:08 AM

## 2023-08-18 ENCOUNTER — Encounter: Payer: Self-pay | Admitting: Ophthalmology

## 2023-12-09 ENCOUNTER — Ambulatory Visit: Payer: Medicare Other

## 2023-12-09 DIAGNOSIS — K5730 Diverticulosis of large intestine without perforation or abscess without bleeding: Secondary | ICD-10-CM | POA: Diagnosis not present

## 2023-12-09 DIAGNOSIS — Z1211 Encounter for screening for malignant neoplasm of colon: Secondary | ICD-10-CM | POA: Diagnosis present

## 2023-12-09 DIAGNOSIS — K589 Irritable bowel syndrome without diarrhea: Secondary | ICD-10-CM | POA: Diagnosis not present

## 2023-12-09 DIAGNOSIS — D124 Benign neoplasm of descending colon: Secondary | ICD-10-CM | POA: Diagnosis not present

## 2023-12-09 DIAGNOSIS — K3189 Other diseases of stomach and duodenum: Secondary | ICD-10-CM | POA: Diagnosis not present

## 2023-12-09 DIAGNOSIS — D128 Benign neoplasm of rectum: Secondary | ICD-10-CM | POA: Diagnosis not present

## 2023-12-09 DIAGNOSIS — Z85038 Personal history of other malignant neoplasm of large intestine: Secondary | ICD-10-CM | POA: Diagnosis not present

## 2023-12-09 DIAGNOSIS — K2270 Barrett's esophagus without dysplasia: Secondary | ICD-10-CM | POA: Diagnosis not present

## 2023-12-30 ENCOUNTER — Encounter: Payer: Self-pay | Admitting: Internal Medicine

## 2023-12-30 ENCOUNTER — Inpatient Hospital Stay

## 2023-12-30 ENCOUNTER — Inpatient Hospital Stay: Attending: Internal Medicine | Admitting: Internal Medicine

## 2023-12-30 VITALS — BP 129/72 | HR 70 | Temp 98.1°F | Resp 16 | Ht 70.98 in | Wt 280.6 lb

## 2023-12-30 DIAGNOSIS — I10 Essential (primary) hypertension: Secondary | ICD-10-CM | POA: Insufficient documentation

## 2023-12-30 DIAGNOSIS — D509 Iron deficiency anemia, unspecified: Secondary | ICD-10-CM | POA: Insufficient documentation

## 2023-12-30 DIAGNOSIS — D649 Anemia, unspecified: Secondary | ICD-10-CM

## 2023-12-30 DIAGNOSIS — E119 Type 2 diabetes mellitus without complications: Secondary | ICD-10-CM | POA: Insufficient documentation

## 2023-12-30 LAB — URINALYSIS, COMPLETE (UACMP) WITH MICROSCOPIC
Bacteria, UA: NONE SEEN
Bilirubin Urine: NEGATIVE
Glucose, UA: NEGATIVE mg/dL
Hgb urine dipstick: NEGATIVE
Ketones, ur: NEGATIVE mg/dL
Leukocytes,Ua: NEGATIVE
Nitrite: NEGATIVE
Protein, ur: NEGATIVE mg/dL
Specific Gravity, Urine: 1.015 (ref 1.005–1.030)
Squamous Epithelial / HPF: 0 /HPF (ref 0–5)
pH: 5 (ref 5.0–8.0)

## 2023-12-30 NOTE — Progress Notes (Signed)
 Fatigue/weakness: YES Dyspena: NO  Light headedness: NO Blood in stool: NO

## 2023-12-30 NOTE — Assessment & Plan Note (Addendum)
#   Chronic Anemia- Hb-symptomatic.  Likely due to iron deficiency  FEb 2025- PCP- ferritin- 9. #Recommend gentle iron [iron biglycinate; 28 mg ] 1 pill a day.  This pill is unlikely to cause stomach upset or cause constipation.   Lack of improvement on oral iron.  Discussed regarding IV iron infusion/Venofer. Discussed the potential acute infusion reactions with IV iron; which are quite rare.  Patient understands the risk; will proceed with infusions.   #Etiology of iron deficiency: Unclear; I had a long discussion with the patient regarding multiple etiologies of anemia including iron deficiency-which is mainly caused by blood loss/malabsorption.  [facility]- GI evaluation-EGD colonoscopy [macrh 2025- barretss] capsule study- discussed await response to IV iron.  Clinically less likely any malignant causes.  However check urinalysis-if positive consider CT scan abdomen pelvis.   # Diabetes- /Hypogonadism-Ozempic/metformin [testosterone q 14 days > 10 years]-stable.  Thank you Dr.Solum for allowing me to participate in the care of your pleasant patient. Please do not hesitate to contact me with questions or concerns in the interim.  # DISPOSITION: # NO labs today; order UA today # weekly venofer x3  # follow up  2 month- MD;[facility] panel [ordered]  please order the rest- labs- cbc/bmp; iron studies;ferritin; LDH;haptoglobin-retic count;  possible venofer- Dr.B  Addendum: UA- negative for blood. Pt informed.  Follow-up as planned GB

## 2023-12-30 NOTE — Patient Instructions (Signed)
#  Recommend gentle iron [iron biglycinate; 28 mg ] 1 pill a day.  This pill is unlikely to cause stomach upset or cause constipation.

## 2023-12-30 NOTE — Progress Notes (Signed)
 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  HISTORY OF PRESENTING ILLNESS:  Kyle Washington 73 y.o.  male pleasant patient is  been referred to [place] for further evaluation of anemia.  Patient complains of excessive fatigue.  Patient has been on oral iron for the last 5 months.   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   Prior blood transfusion: none  Kidney/Liver disease: none  Alcohol: rare Bariatric surgery: none   Prior evaluation with hematology: none Prior bone marrow biopsy: none  Oral iron: OTC 65 mg - one a day x last 5 months- ? GI [facility]  Prior IV iron infusions: none     Review of Systems  Constitutional:  Positive for malaise/fatigue. Negative for chills, diaphoresis, fever and weight loss.  HENT:  Negative for nosebleeds and sore throat.   Eyes:  Negative for double vision.  Respiratory:  Negative for cough, hemoptysis, sputum production, shortness of breath and wheezing.   Cardiovascular:  Negative for chest pain, palpitations, orthopnea and leg swelling.  Gastrointestinal:  Negative for abdominal pain, blood in stool, constipation, diarrhea, heartburn, melena, nausea and vomiting.  Genitourinary:  Negative for dysuria, frequency and urgency.  Musculoskeletal:  Negative for back pain and joint pain.  Skin: Negative.  Negative for itching and rash.  Neurological:  Negative for dizziness, tingling, focal weakness, weakness and headaches.  Endo/Heme/Allergies:   Does not bruise/bleed easily.  Psychiatric/Behavioral:  Negative for depression. The patient is not nervous/anxious and does not have insomnia.      MEDICAL HISTORY:  Past Medical History:  Diagnosis Date   Arthritis    Barrett's esophagus    COVID-19 03/2023   Resolved   Hypertension    Hypogonadism, male    OSA on CPAP    Sleep apnea    CPAP   Type 2 diabetes mellitus (HCC)    Vertigo    several years ago   Wears hearing aid in both ears     SURGICAL HISTORY: Past Surgical History:  Procedure Laterality Date   BACK SURGERY  09/2021   CATARACT EXTRACTION W/PHACO Right 08/04/2023   Procedure: CATARACT EXTRACTION PHACO AND INTRAOCULAR LENS PLACEMENT (IOC) RIGHT DIABETIC 9.90 00:50.3;  Surgeon: Galen Manila, MD;  Location: [facility];  Service: Ophthalmology;  Laterality: Right;   CATARACT EXTRACTION W/PHACO Left 08/17/2023   Procedure: CATARACT EXTRACTION PHACO AND INTRAOCULAR LENS PLACEMENT (IOC) LEFT DIABETIC;  Surgeon: Galen Manila, MD;  Location: [facility];  Service: Ophthalmology;  Laterality: Left;  7.18 0:57.3   TOTAL HIP ARTHROPLASTY Right 11/2022    SOCIAL HISTORY: Social History   Socioeconomic History   Marital status: Married    Spouse name: Not on file   Number of children: Not on file   Years of education: Not on file   Highest education level: Not on file  Occupational History   Not on file  Tobacco Use   Smoking status: Never   Smokeless tobacco: Never  Vaping Use   Vaping status: Never Used  Substance and Sexual Activity  Alcohol use: Yes    Alcohol/week: 2.0 standard drinks of alcohol    Types: 2 Standard drinks or equivalent per week   Drug use: Never   Sexual activity: Not on file  Other Topics Concern   Not on file  Social History Narrative   Father- had Lymphoma; Hx of lung cancer.      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    Social Drivers of  Corporate investment banker Strain: Low Risk  (09/24/2023)   Received from [facility]   Overall Financial Resource Strain (CARDIA)    Difficulty of Paying Living Expenses: Not hard at all  Food Insecurity: No Food Insecurity (12/30/2023)   Hunger Vital Sign    Worried About Running Out of Food in the Last Year: Never true    Ran Out of Food in the Last Year: Never true  Transportation Needs: No Transportation Needs (12/30/2023)   PRAPARE - Administrator, Civil Service (Medical): No    Lack of Transportation (Non-Medical): No  Physical Activity: Not on file  Stress: Not on file  Social Connections: Not on file  Intimate Partner Violence: Not At Risk (12/30/2023)   Humiliation, Afraid, Rape, and Kick questionnaire    Fear of Current or Ex-Partner: No    Emotionally Abused: No    Physically Abused: No    Sexually Abused: No    FAMILY HISTORY: Family History  Problem Relation Age of Onset   Lymphoma Father    Lung cancer Father    Diabetes Paternal Grandfather     ALLERGIES:  has no known allergies.  MEDICATIONS:  Current Outpatient Medications  Medication Sig Dispense Refill   acetaminophen (TYLENOL) 500 MG tablet Take 1,000 mg by mouth every 6 (six) hours as needed.     amLODipine (NORVASC) 5 MG tablet Take 5 mg by mouth daily.     atorvastatin (LIPITOR) 20 MG tablet Take 20 mg by mouth daily.     Cholecalciferol (VITAMIN D-1000 MAX ST) 25 MCG (1000 UT) tablet Take 1,000 Units by mouth daily.     escitalopram (LEXAPRO) 20 MG tablet Take 20 mg by mouth daily.     ferrous sulfate 325 (65 FE) MG tablet Take 325 mg by mouth daily with breakfast.     losartan (COZAAR) 100 MG tablet Take 100 mg by mouth daily.     MAGNESIUM CITRATE PO Take 500 mg by mouth in the morning and at bedtime.     metformin (FORTAMET) 500 MG (OSM) 24 hr tablet Take 1,000 mg by mouth 2 (two) times daily with a meal.     Multiple Vitamins-Minerals (ONE-A-DAY MENS 50+  ADVANTAGE) TABS Take 1 tablet by mouth daily.     Omega-3 Fatty Acids (FISH OIL) 1000 MG CAPS Take 1 capsule by mouth daily.     omeprazole (PRILOSEC) 20 MG capsule Take 20 mg by mouth daily.     OVER THE COUNTER MEDICATION daily. Balance of Nature Fruits & Vegetables     OZEMPIC, 0.25 OR 0.5 MG/DOSE, 2 MG/3ML SOPN Inject 0.75 mg into the skin once a week.     pregabalin (LYRICA) 50 MG capsule Take 75 mg by mouth daily.     testosterone cypionate (DEPOTESTOSTERONE CYPIONATE) 200 MG/ML injection SMARTSIG:0.45 Milliliter(s) IM Every 2 Weeks     traZODone (DESYREL) 50 MG tablet Take 50-100 mg by mouth at bedtime as needed.     METHYLCOBALAMIN  PO Take 500 mcg by mouth daily.     No current facility-administered medications for this visit.     PHYSICAL EXAMINATION:   Vitals:   12/30/23 1059  BP: 129/72  Pulse: 70  Resp: 16  Temp: 98.1 F (36.7 C)  SpO2: 96%   Filed Weights   12/30/23 1059  Weight: 280 lb 9.6 oz (127.3 kg)    Physical Exam Vitals and nursing note reviewed.  HENT:     Head: Normocephalic and atraumatic.     Mouth/Throat:     Pharynx: Oropharynx is clear.  Eyes:     Extraocular Movements: Extraocular movements intact.     Pupils: Pupils are equal, round, and reactive to light.  Cardiovascular:     Rate and Rhythm: Normal rate and regular rhythm.  Pulmonary:     Comments: Decreased breath sounds bilaterally.  Abdominal:     Palpations: Abdomen is soft.  Musculoskeletal:        General: Normal range of motion.     Cervical back: Normal range of motion.  Skin:    General: Skin is warm.  Neurological:     General: No focal deficit present.     Mental Status: He is alert and oriented to person, place, and time.  Psychiatric:        Behavior: Behavior normal.        Judgment: Judgment normal.      LABORATORY DATA:  I have reviewed the data as listed Lab Results  Component Value Date   WBC 6.2 04/27/2022   HGB 15.1 04/27/2022   HCT 45.6 04/27/2022    MCV 86.4 04/27/2022   PLT 318 04/27/2022   No results for input(s): "NA", "K", "CL", "CO2", "GLUCOSE", "BUN", "CREATININE", "CALCIUM", "GFRNONAA", "GFRAA", "PROT", "ALBUMIN", "AST", "ALT", "ALKPHOS", "BILITOT", "BILIDIR", "IBILI" in the last 8760 hours.   No results found.  ASSESSMENT & PLAN:   Symptomatic anemia # Chronic Anemia- Hb-symptomatic.  Likely due to iron deficiency  FEb 2025- PCP- ferritin- 9. #Recommend gentle iron [iron biglycinate; 28 mg ] 1 pill a day.  This pill is unlikely to cause stomach upset or cause constipation.   Lack of improvement on oral iron.  Discussed regarding IV iron infusion/Venofer. Discussed the potential acute infusion reactions with IV iron; which are quite rare.  Patient understands the risk; will proceed with infusions.   #Etiology of iron deficiency: Unclear; I had a long discussion with the patient regarding multiple etiologies of anemia including iron deficiency-which is mainly caused by blood loss/malabsorption.  [facility]- GI evaluation-EGD colonoscopy [[facility] 2025- barretss] capsule study- discussed await response to IV iron.  Clinically less likely any malignant causes.  However check urinalysis-if positive consider CT scan abdomen pelvis.   # Diabetes- /Hypogonadism-Ozempic/metformin [testosterone q 14 days > 10 years]-stable.  Thank you Dr.Solum for allowing me to participate in the care of your pleasant patient. Please do not hesitate to contact me with questions or concerns in the interim.  # DISPOSITION: # NO labs today; order UA today # weekly venofer x3  # follow up  2 month- MD;[facility] panel [ordered]  please order the rest- labs- cbc/bmp; iron studies;ferritin; LDH;haptoglobin-retic count;  possible venofer- Dr.B  Addendum: UA- negative for blood. Pt informed.  Follow-up as planned GB  All questions were answered. The patient knows to call the clinic with any problems, questions or concerns.   Earna Coder, MD 12/30/2023 1:06 PM

## 2024-01-04 ENCOUNTER — Inpatient Hospital Stay

## 2024-01-04 VITALS — BP 135/76 | HR 71 | Temp 97.4°F | Resp 19

## 2024-01-04 DIAGNOSIS — D509 Iron deficiency anemia, unspecified: Secondary | ICD-10-CM | POA: Diagnosis not present

## 2024-01-04 DIAGNOSIS — D649 Anemia, unspecified: Secondary | ICD-10-CM

## 2024-01-04 MED ORDER — SODIUM CHLORIDE 0.9% FLUSH
10.0000 mL | Freq: Once | INTRAVENOUS | Status: AC | PRN
  Administered 2024-01-04: 10 mL
  Filled 2024-01-04: qty 10

## 2024-01-04 MED ORDER — IRON SUCROSE 20 MG/ML IV SOLN
200.0000 mg | Freq: Once | INTRAVENOUS | Status: AC
  Administered 2024-01-04: 200 mg via INTRAVENOUS

## 2024-01-11 ENCOUNTER — Inpatient Hospital Stay

## 2024-01-11 VITALS — BP 122/68 | HR 66 | Temp 97.1°F | Resp 19

## 2024-01-11 DIAGNOSIS — D649 Anemia, unspecified: Secondary | ICD-10-CM

## 2024-01-11 DIAGNOSIS — D509 Iron deficiency anemia, unspecified: Secondary | ICD-10-CM | POA: Diagnosis not present

## 2024-01-11 MED ORDER — IRON SUCROSE 20 MG/ML IV SOLN
200.0000 mg | Freq: Once | INTRAVENOUS | Status: AC
Start: 2024-01-11 — End: 2024-01-11
  Administered 2024-01-11: 200 mg via INTRAVENOUS

## 2024-01-11 MED ORDER — SODIUM CHLORIDE 0.9% FLUSH
10.0000 mL | Freq: Once | INTRAVENOUS | Status: AC | PRN
Start: 2024-01-11 — End: 2024-01-11
  Administered 2024-01-11: 10 mL
  Filled 2024-01-11: qty 10

## 2024-01-18 ENCOUNTER — Inpatient Hospital Stay: Attending: Internal Medicine

## 2024-01-18 VITALS — BP 121/71 | HR 74 | Resp 16

## 2024-01-18 DIAGNOSIS — D509 Iron deficiency anemia, unspecified: Secondary | ICD-10-CM | POA: Diagnosis present

## 2024-01-18 DIAGNOSIS — D649 Anemia, unspecified: Secondary | ICD-10-CM

## 2024-01-18 MED ORDER — IRON SUCROSE 20 MG/ML IV SOLN
200.0000 mg | Freq: Once | INTRAVENOUS | Status: AC
  Administered 2024-01-18: 200 mg via INTRAVENOUS
  Filled 2024-01-18: qty 10

## 2024-02-29 ENCOUNTER — Ambulatory Visit

## 2024-02-29 ENCOUNTER — Other Ambulatory Visit

## 2024-02-29 ENCOUNTER — Ambulatory Visit: Admitting: Internal Medicine

## 2024-03-15 ENCOUNTER — Inpatient Hospital Stay

## 2024-03-15 ENCOUNTER — Inpatient Hospital Stay: Attending: Internal Medicine

## 2024-03-15 ENCOUNTER — Inpatient Hospital Stay: Admitting: Internal Medicine

## 2024-03-15 ENCOUNTER — Encounter: Payer: Self-pay | Admitting: Internal Medicine

## 2024-03-15 VITALS — BP 123/75 | HR 63 | Resp 18

## 2024-03-15 DIAGNOSIS — D649 Anemia, unspecified: Secondary | ICD-10-CM

## 2024-03-15 DIAGNOSIS — D509 Iron deficiency anemia, unspecified: Secondary | ICD-10-CM | POA: Diagnosis present

## 2024-03-15 LAB — RETICULOCYTES
Immature Retic Fract: 11.9 % (ref 2.3–15.9)
RBC.: 4.18 MIL/uL — ABNORMAL LOW (ref 4.22–5.81)
Retic Count, Absolute: 65.2 10*3/uL (ref 19.0–186.0)
Retic Ct Pct: 1.6 % (ref 0.4–3.1)

## 2024-03-15 LAB — CBC WITH DIFFERENTIAL (CANCER CENTER ONLY)
Abs Immature Granulocytes: 0.07 10*3/uL (ref 0.00–0.07)
Basophils Absolute: 0.1 10*3/uL (ref 0.0–0.1)
Basophils Relative: 1 %
Eosinophils Absolute: 0.3 10*3/uL (ref 0.0–0.5)
Eosinophils Relative: 5 %
HCT: 39.7 % (ref 39.0–52.0)
Hemoglobin: 12.9 g/dL — ABNORMAL LOW (ref 13.0–17.0)
Immature Granulocytes: 1 %
Lymphocytes Relative: 31 %
Lymphs Abs: 2 10*3/uL (ref 0.7–4.0)
MCH: 31 pg (ref 26.0–34.0)
MCHC: 32.5 g/dL (ref 30.0–36.0)
MCV: 95.4 fL (ref 80.0–100.0)
Monocytes Absolute: 0.6 10*3/uL (ref 0.1–1.0)
Monocytes Relative: 9 %
Neutro Abs: 3.5 10*3/uL (ref 1.7–7.7)
Neutrophils Relative %: 53 %
Platelet Count: 299 10*3/uL (ref 150–400)
RBC: 4.16 MIL/uL — ABNORMAL LOW (ref 4.22–5.81)
RDW: 14.1 % (ref 11.5–15.5)
WBC Count: 6.6 10*3/uL (ref 4.0–10.5)
nRBC: 0 % (ref 0.0–0.2)

## 2024-03-15 LAB — IRON AND TIBC
Iron: 88 ug/dL (ref 45–182)
Saturation Ratios: 21 % (ref 17.9–39.5)
TIBC: 424 ug/dL (ref 250–450)
UIBC: 336 ug/dL

## 2024-03-15 LAB — BASIC METABOLIC PANEL WITH GFR
Anion gap: 10 (ref 5–15)
BUN: 28 mg/dL — ABNORMAL HIGH (ref 8–23)
CO2: 22 mmol/L (ref 22–32)
Calcium: 8.7 mg/dL — ABNORMAL LOW (ref 8.9–10.3)
Chloride: 105 mmol/L (ref 98–111)
Creatinine, Ser: 1.15 mg/dL (ref 0.61–1.24)
GFR, Estimated: >60 mL/min (ref >60)
Glucose, Bld: 107 mg/dL — ABNORMAL HIGH (ref 70–99)
Potassium: 4.2 mmol/L (ref 3.5–5.1)
Sodium: 137 mmol/L (ref 135–145)

## 2024-03-15 LAB — LACTATE DEHYDROGENASE: LDH: 95 U/L — ABNORMAL LOW (ref 98–192)

## 2024-03-15 LAB — FERRITIN: Ferritin: 59 ng/mL (ref 24–336)

## 2024-03-15 MED ORDER — SODIUM CHLORIDE 0.9% FLUSH
10.0000 mL | Freq: Once | INTRAVENOUS | Status: AC | PRN
  Administered 2024-03-15: 10 mL
  Filled 2024-03-15: qty 10

## 2024-03-15 MED ORDER — IRON SUCROSE 20 MG/ML IV SOLN
200.0000 mg | Freq: Once | INTRAVENOUS | Status: AC
  Administered 2024-03-15: 200 mg via INTRAVENOUS
  Filled 2024-03-15: qty 10

## 2024-03-15 NOTE — Progress Notes (Signed)
 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  EGD/colonoscopy-[[facility]- GI-MARCH 2025 ]   HISTORY OF PRESENTING ILLNESS: Patient ambulating-independently.  Alone   Kyle Washington 73 y.o.  male pleasant patient is  been referred to us  for further evaluation of anemia-iron  since he unclear etiology is here for follow-up.  Patient says that the iron  infusions do help with his energy. He is feeling a little bit better.   Patient is currently on iron  sulfate.  Noted to have intermittent constipation/diarrhea.    Review of Systems  Constitutional:  Positive for malaise/fatigue. Negative for chills, diaphoresis, fever and weight loss.  HENT:  Negative for nosebleeds and sore throat.   Eyes:  Negative for double vision.  Respiratory:  Negative for cough, hemoptysis, sputum production, shortness of breath and wheezing.   Cardiovascular:  Negative for chest pain, palpitations, orthopnea and leg swelling.  Gastrointestinal:  Negative for abdominal pain, blood in stool, constipation, diarrhea, heartburn, melena, nausea and vomiting.  Genitourinary:  Negative for dysuria, frequency and urgency.  Musculoskeletal:  Negative for back pain and joint pain.  Skin: Negative.  Negative for itching and rash.  Neurological:  Negative for dizziness, tingling, focal weakness, weakness and headaches.  Endo/Heme/Allergies:  Does not bruise/bleed easily.  Psychiatric/Behavioral:  Negative for depression. The patient is not nervous/anxious and does  not have insomnia.      MEDICAL HISTORY:  Past Medical History:  Diagnosis Date   Arthritis    Barrett's esophagus    COVID-19 03/2023   Resolved   Hypertension    Hypogonadism, male    OSA on CPAP    Sleep apnea    CPAP   Type 2 diabetes mellitus (HCC)    Vertigo    several years ago   Wears hearing aid in both ears     SURGICAL HISTORY: Past Surgical History:  Procedure Laterality Date   BACK SURGERY  09/2021   CATARACT EXTRACTION W/PHACO Right 08/04/2023   Procedure: CATARACT EXTRACTION PHACO AND INTRAOCULAR LENS PLACEMENT (IOC) RIGHT DIABETIC 9.90 00:50.3;  Surgeon: Clair Crews, MD;  Location: [facility];  Service: Ophthalmology;  Laterality: Right;   CATARACT EXTRACTION W/PHACO Left 08/17/2023   Procedure: CATARACT EXTRACTION PHACO AND INTRAOCULAR LENS PLACEMENT (IOC) LEFT DIABETIC;  Surgeon: Clair Crews, MD;  Location: [facility];  Service: Ophthalmology;  Laterality: Left;  7.18 0:57.3   TOTAL HIP ARTHROPLASTY Right 11/2022    SOCIAL HISTORY: Social History   Socioeconomic History   Marital status: Married    Spouse name: Not on file   Number of children: Not on file   Years of education: Not on file   Highest education level: Not on file  Occupational History   Not on file  Tobacco Use   Smoking status: Never   Smokeless tobacco: Never  Vaping Use   Vaping status: Never Used  Substance and Sexual Activity   Alcohol use: Yes    Alcohol/week: 2.0 standard drinks of alcohol    Types: 2 Standard drinks or equivalent per week   Drug use: Never   Sexual activity:  Not on file  Other Topics Concern   Not on file  Social History Narrative   Father- had Lymphoma; Hx of lung cancer.      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    Social Drivers of Corporate investment banker Strain: Low Risk  (09/24/2023)   Received from [facility]   Overall Financial  Resource Strain (CARDIA)    Difficulty of Paying Living Expenses: Not hard at all  Food Insecurity: No Food Insecurity (12/30/2023)   Hunger Vital Sign    Worried About Running Out of Food in the Last Year: Never true    Ran Out of Food in the Last Year: Never true  Transportation Needs: No Transportation Needs (12/30/2023)   PRAPARE - Administrator, Civil Service (Medical): No    Lack of Transportation (Non-Medical): No  Physical Activity: Not on file  Stress: Not on file  Social Connections: Not on file  Intimate Partner Violence: Not At Risk (12/30/2023)   Humiliation, Afraid, Rape, and Kick questionnaire    Fear of Current or Ex-Partner: No    Emotionally Abused: No    Physically Abused: No    Sexually Abused: No    FAMILY HISTORY: Family History  Problem Relation Age of Onset   Lymphoma Father    Lung cancer Father    Diabetes Paternal Grandfather     ALLERGIES:  has no known allergies.  MEDICATIONS:  Current Outpatient Medications  Medication Sig Dispense Refill   acetaminophen  (TYLENOL ) 500 MG tablet Take 1,000 mg by mouth every 6 (six) hours as needed.     amLODipine  (NORVASC ) 5 MG tablet Take 5 mg by mouth daily.     atorvastatin  (LIPITOR) 20 MG tablet Take 20 mg by mouth daily.     Cholecalciferol (VITAMIN D-1000 MAX ST) 25 MCG (1000 UT) tablet Take 1,000 Units by mouth daily.     escitalopram  (LEXAPRO ) 20 MG tablet Take 20 mg by mouth daily.     ferrous sulfate 325 (65 FE) MG tablet Take 325 mg by mouth daily with breakfast.     losartan  (COZAAR ) 100 MG tablet Take 100 mg by mouth daily.     MAGNESIUM  CITRATE PO Take 500 mg by mouth in the morning and at bedtime.     metformin (FORTAMET) 500 MG (OSM) 24 hr tablet Take 1,000 mg by mouth 2 (two) times daily with a meal.     METHYLCOBALAMIN PO Take 500 mcg by mouth daily.     Multiple Vitamins-Minerals (ONE-A-DAY MENS 50+ ADVANTAGE) TABS Take 1 tablet by mouth daily.     Omega-3 Fatty Acids (FISH OIL)  1000 MG CAPS Take 1 capsule by mouth daily.     omeprazole (PRILOSEC) 20 MG capsule Take 20 mg by mouth daily.     OVER THE COUNTER MEDICATION daily. Balance of Nature Fruits & Vegetables     OZEMPIC, 0.25 OR 0.5 MG/DOSE, 2 MG/3ML SOPN Inject 0.75 mg into the skin once a week.     pregabalin (LYRICA) 50 MG capsule Take 75 mg by mouth daily.     testosterone cypionate (DEPOTESTOSTERONE CYPIONATE) 200 MG/ML injection SMARTSIG:0.45 Milliliter(s) IM Every 2 Weeks     traZODone  (DESYREL ) 50 MG tablet Take 50-100 mg by mouth at bedtime as needed.     No current facility-administered medications for this visit.     PHYSICAL EXAMINATION:   Vitals:   03/15/24 1025  BP: 119/71  Pulse: 71  Resp: 18  Temp: 98.2 F (36.8 C)  SpO2: 97%   Filed Weights   03/15/24 1025  Weight: 274 lb (124.3 kg)    Physical Exam Vitals and nursing note reviewed.  HENT:     Head: Normocephalic and atraumatic.     Mouth/Throat:     Pharynx: Oropharynx is clear.  Eyes:     Extraocular Movements: Extraocular movements intact.     Pupils: Pupils are equal, round, and reactive to light.  Cardiovascular:     Rate and Rhythm: Normal rate and regular rhythm.  Pulmonary:     Comments: Decreased breath sounds bilaterally.  Abdominal:     Palpations: Abdomen is soft.  Musculoskeletal:        General: Normal range of motion.     Cervical back: Normal range of motion.  Skin:    General: Skin is warm.  Neurological:     General: No focal deficit present.     Mental Status: He is alert and oriented to person, place, and time.  Psychiatric:        Behavior: Behavior normal.        Judgment: Judgment normal.      LABORATORY DATA:  I have reviewed the data as listed Lab Results  Component Value Date   WBC 6.6 03/15/2024   HGB 12.9 (L) 03/15/2024   HCT 39.7 03/15/2024   MCV 95.4 03/15/2024   PLT 299 03/15/2024   Recent Labs    03/15/24 1028  NA 137  K 4.2  CL 105  CO2 22  GLUCOSE 107*  BUN 28*   CREATININE 1.15  CALCIUM  8.7*  GFRNONAA >60     No results found.  ASSESSMENT & PLAN:   Symptomatic anemia # Chronic Anemia- Hb-symptomatic.  Likely due to iron  deficiency  FEb 2025-Hb 10-  PCP- ferritin- 9. Recommend gentle iron  [iron  biglycinate; 28 mg ] 1 pill a day.  This pill is unlikely to cause stomach upset or cause constipation.   # s/p  IV iron  infusion/ Venofer  x3-hemoglobin improved at 12.9 today.  Iron  studies pending.  Proceed with Venofer  continue gentle iron .  #Etiology of iron  deficiency: Unclear; I had a long discussion with the patient regarding multiple etiologies of anemia including iron  deficiency-which is mainly caused by blood loss/malabsorption.  [facility]- GI evaluation-EGD colonoscopy [[facility] 2025- barretss] capsule study- discussed await response to IV iron .  Clinically less likely any malignant causes.  However  urinalysis- Negative for blood. HOLD  CT scan abdomen pelvis. [facility].   # Diabetes- /Hypogonadism-Ozempic/metformin [testosterone q 14 days > 10 years]-stable.  # DISPOSITION: # venofer   today  # follow up  4 month- MD 2-3 days prior- labs- cbc/bmp; iron  studies;ferritin; possible venofer - Dr.B  All questions were answered. The patient knows to call the clinic with any problems, questions or concerns.   Gwyn Leos, MD 03/15/2024 11:52 AM

## 2024-03-15 NOTE — Patient Instructions (Signed)
#  Recommend gentle iron [iron biglycinate; 28 mg ] 1 pill a day.  This pill is unlikely to cause stomach upset or cause constipation.

## 2024-03-15 NOTE — Assessment & Plan Note (Addendum)
#   Chronic Anemia- Hb-symptomatic.  Likely due to iron  deficiency  FEb 2025-Hb 10-  PCP- ferritin- 9. Recommend gentle iron  [iron  biglycinate; 28 mg ] 1 pill a day.  This pill is unlikely to cause stomach upset or cause constipation.   # s/p  IV iron  infusion/ Venofer  x3-hemoglobin improved at 12.9 today.  Iron  studies pending.  Proceed with Venofer  continue gentle iron .  #Etiology of iron  deficiency: Unclear; I had a long discussion with the patient regarding multiple etiologies of anemia including iron  deficiency-which is mainly caused by blood loss/malabsorption.  [facility]- GI evaluation-EGD colonoscopy [[facility] 2025- barretss] capsule study- discussed await response to IV iron .  Clinically less likely any malignant causes.  However  urinalysis- Negative for blood. HOLD  CT scan abdomen pelvis. [facility].   # Diabetes- /Hypogonadism-Ozempic/metformin [testosterone q 14 days > 10 years]-stable.  # DISPOSITION: # venofer   today  # follow up  4 month- MD 2-3 days prior- labs- cbc/bmp; iron  studies;ferritin; possible venofer - Dr.B

## 2024-03-15 NOTE — Progress Notes (Signed)
 Patient says that the iron  infusions do help with his energy. He is feeling a little bit better.

## 2024-03-16 LAB — HAPTOGLOBIN: Haptoglobin: 198 mg/dL (ref 34–355)

## 2024-03-23 LAB — PNH PROFILE (-HIGH SENSITIVITY)

## 2024-03-30 NOTE — Progress Notes (Signed)
 Kyle Washington is a 73 y.o. male here for follow up of their medical problems  CHIEF COMPLAINT:  Follow up medical problems in the problem list and as discussed in the history and assessment areas as well as new complaints as listed.   Patient Active Problem List  Diagnosis  . Essential hypertension  . Prediabetes  . Hypercholesterolemia  . Severe obesity (BMI >= 40) (CMS/HHS-HCC)  . Major depression in remission ()  . Obstructive sleep apnea syndrome  . Health care maintenance  . Testosterone deficiency  . Spinal stenosis of lumbar region with neurogenic claudication  . Spondylolisthesis at L4-L5 level  . Gastroesophageal reflux disease with esophagitis  . Primary osteoarthritis of right hip  . Chronic right hip pain  . Long-term current use of testosterone replacement therapy  . Status post right hip replacement  . Aftercare following right hip joint replacement surgery  . Radiculopathy of lumbar region     HISTORY OF PRESENT ILLNESS:  Notes a disconcerting abd pulsating sensation hs.   Essential hypertension Taking medications without noted side effects or dizziness.    Hypercholesterolemia Healthy fat diet is being followed and no myalgia's are noted.   Major depression in remission (CMS-HCC) Mood is generally doing well   Lateral hand numbness hs   More back and lateral leg pain   Past Medical History:  Diagnosis Date  . AKI (acute kidney injury) () 04/29/2022  . Barrett's esophagus   . Bilateral sciatica 09/07/2021   Bilateral lumbar radiculopathy right greater than left related to lumbar spinal stenosis L2-3, L3-4, L4-5.  [name] Carcinoma of the skin, basal cell   . Colon cancer (CMS/HHS-HCC)   . Depression   . Gastroesophageal reflux disease with esophagitis 09/07/2021  . History of malignant neoplasm of colon 09/07/2021   Status post laparoscopic ileectomy for colon cancer 2005.  [name] Hyperlipidemia   . Hypertension   . Low testosterone   . Lumbar  degenerative disc disease 09/07/2021   Multiple level lumbar degenerative disc disease most pronounced L3-4 L4-5 associated with degenerative spondylolisthesis at both levels.  . Melanoma in situ of back (CMS/HHS-HCC) 09/07/2021   History of melanoma of the back excised 2017.  . Obesity, morbid (CMS/HHS-HCC) 01/20/2020  . Primary osteoarthritis of right hip 09/15/2021  . Skin cancer (melanoma) (CMS/HHS-HCC)   . Sleep apnea   . Spinal stenosis of lumbar region with neurogenic claudication 09/07/2021   Lumbar spinal stenosis L2-3, L3-4, L4-5, L5-S1, with bilateral neurogenic claudication right greater than left.  [name] Spondylolisthesis at L4-L5 level 09/07/2021   Grade 1 degenerative spondylolisthesis L3-4, grade 1 degenerative spondylolisthesis L4-5 with lumbar spinal stenosis, right greater than left neurogenic claudication/lumbar radiculopathy.  . Type 2 diabetes mellitus (CMS/HHS-HCC)     Past Surgical History:  Procedure Laterality Date  . COLON SURGERY  2005   laparoscopic ileectomy for colon ca  . COLONOSCOPY  05/09/2020   Diverticulosis/Otherwise normal/Repeat 51yrs/CTL  . EGD  05/09/2020   Barrett's Esophagus/Repeat 71yrs/CTL  . PERCUTANEOUS SPINAL FUSION Bilateral 09/17/2021   Procedure: BILATERAL LUMBAR DECOMPRESSION L2-3, L3-4, L4-5, SPINAL FUSION L3-4, L4-5. LOCAL AUTOGRAFT/ALLOGRAFT;  Surgeon: Derian, Thomas Craig, MD;  Location: [facility] OR;  Service: Orthopedics;  Laterality: Bilateral;  . INSTRUMENTATION POSTERIOR SPINE 7 TO 12 VERTEBRAL SEGMENTS Bilateral 09/17/2021   Procedure: POSTERIOR SEGMENTAL INSTRUMENTATION (EG, PEDICLE FIXATION, DUAL RODS WITH MULTIPLE HOOKS AND SUBLAMINAR WIRES); 3 TO 6 VERTEBRAL SEGMENTS (LIST IN ADDITION TO PRIMARY PROCEDURE);  Surgeon: Pascal Debby Banks, MD;  Location: [facility] OR;  Service:  Orthopedics;  Laterality: Bilateral;  . LAMINECTOMY POSTERIOR LUMBAR FACETECTOMY & FORAMINOTOMY W/DECOMP Bilateral 09/17/2021   Procedure: LAMINECTOMY, FACETECTOMY AND  FORAMINOTOMY (UNILATERAL OR BILATERAL WITH DECOMPRESSION OF SPINAL CORD, CAUDA EQUINA AND/OR NERVE ROOT(S), SINGLE VERTEBRAL SEGMENT; LUMBAR;  Surgeon: Pascal Debby Banks, MD;  Location: [facility] OR;  Service: Orthopedics;  Laterality: Bilateral;  . LAMINECTOMY POSTERIOR CERVICLE DECOMP W/FACETECTOMY & FORAMINOTOMY Bilateral 09/17/2021   Procedure: LAMINECTOMY, FACETECTOMY AND FORAMINOTOMY, SINGLE VERTEBRAL SEGMENT; EACH ADDITIONAL vertebral SEGMENT, CERVICAL, THORACIC, OR LUMBAR (LIST IN ADDITION TO PRIMARY PROCEDURE);  Surgeon: Pascal Debby Banks, MD;  Location: [facility] OR;  Service: Orthopedics;  Laterality: Bilateral;  . ARTHRODESIS POSTERIOR SPINE Bilateral 09/17/2021   Procedure: ARTHRODESIS, POSTERIOR OR POSTEROLATERAL TECHNIQUE, SINGLE interspace; EACH ADDITIONAL (LIST IN ADDITION TO PRIMARY PROCEDURE);  Surgeon: Pascal Debby Banks, MD;  Location: [facility] OR;  Service: Orthopedics;  Laterality: Bilateral;  . ARTHROPLASTY HIP TOTAL Right 12/04/2022   Procedure: TOTAL HIP ARTHROPLASTY;  Surgeon: Taft Jayson Lenis, MD;  Location: [facility] OR;  Service: Orthopedics;  Laterality: Right;  . Colon @ [facility]  12/09/2023   Tubular adenomas/PHx CP/Repeat 72yrs/SMR  . EGD @ [facility]  12/09/2023   Barrett's Esophagus/Repeat 18yrs/SMR  . ARTHROSCOPY SHOULDER Right    rotator cuff repair  . BACK SURGERY  09/17/2021   Lumbar Spine Fusion  . Dequervein's release Left   . LAPAROSCOPIC COLON RESECTION    . melanoma removal     2016  . VASECTOMY  1985     No fever chills or sweats   No nausea, vomiting or diarrhea  No chest pain, shortness of breath   Social History   Socioeconomic History  . Marital status: Married  Tobacco Use  . Smoking status: Never  . Smokeless tobacco: Never  Vaping Use  . Vaping status: Never Used  Substance and Sexual Activity  . Alcohol use: Yes    Alcohol/week: 3.0 standard drinks of alcohol    Types: 3 Standard drinks or equivalent per week  . Drug use: Never  . Sexual activity:  Not Currently    Partners: Female    Birth control/protection: None   Social Drivers of Health   Financial Resource Strain: Low Risk  (09/24/2023)   Overall Financial Resource Strain (CARDIA)   . Difficulty of Paying Living Expenses: Not hard at all  Food Insecurity: No Food Insecurity (12/30/2023)   Received from [facility]   Hunger Vital Sign   . Within the past 12 months, you worried that your food would run out before you got the money to buy more.: Never true   . Within the past 12 months, the food you bought just didn't last and you didn't have money to get more.: Never true  Transportation Needs: No Transportation Needs (12/30/2023)   Received from [facility] - Transportation   . Lack of Transportation (Medical): No   . Lack of Transportation (Non-Medical): No  Housing Stability: Low Risk  (11/25/2023)   Housing Stability Vital Sign   . Unable to Pay for Housing in the Last Year: No   . Number of Times Moved in the Last Year: 0   . Homeless in the Last Year: No      Current Outpatient Medications:  .  acetaminophen  (TYLENOL ) 500 MG tablet, Take 1,000 mg by mouth at bedtime, Disp: , Rfl:  .  amLODIPine  (NORVASC ) 5 MG tablet, TAKE 1 TABLET BY MOUTH EVERY DAY, Disp: 90 tablet, Rfl: 3 .  atorvastatin  (LIPITOR) 20 MG tablet, TAKE  1 TABLET BY MOUTH EVERY DAY, Disp: 90 tablet, Rfl: 3 .  cholecalciferol (VITAMIN D3) 1000 unit tablet, Take 1 tablet (1,000 Units total) by mouth once daily, Disp: , Rfl:  .  cyanocobalamin  (VITAMIN B12) 1000 MCG tablet, Take 1 tablet (1,000 mcg total) by mouth once daily, Disp: , Rfl:  .  escitalopram  oxalate (LEXAPRO ) 20 MG tablet, TAKE 1 TABLET BY MOUTH EVERY DAY, Disp: 90 tablet, Rfl: 1 .  ferrous sulfate (IRON ) 325 (65 FE) MG tablet, Take 325 mg by mouth daily with breakfast, Disp: , Rfl:  .  losartan  (COZAAR ) 100 MG tablet, TAKE 1 TABLET (100 MG TOTAL) BY MOUTH ONCE DAILY DO NOT TAKE THIS MEDICATION UNTIL YOU ARE INSTRUCTED TO DO SO BY  YOUR DOCTOR., Disp: 90 tablet, Rfl: 3 .  MAGNESIUM  CITRATE ORAL, Take 250 mg by mouth 2 (two) times daily, Disp: , Rfl:  .  metFORMIN (GLUCOPHAGE-XR) 500 MG XR tablet, TAKE 2 TABLETS BY MOUTH TWICE A DAY, Disp: 360 tablet, Rfl: 1 .  multivit-min-folic-vit K-lycop (ONE-A-DAY MEN'S 50 PLUS) 400-20-370 mcg Tab, Take 1 tablet by mouth once daily, Disp: , Rfl:  .  omega-3 fatty acids/fish oil (OMEGA 3 FISH OIL ORAL), Take 1 capsule by mouth once daily, Disp: , Rfl:  .  omeprazole (PRILOSEC) 20 MG DR capsule, take 1 capsule by mouth every day, Disp: 90 capsule, Rfl: 3 .  pregabalin (LYRICA) 75 MG capsule, Take 1 capsule (75 mg total) by mouth 2 (two) times daily (Patient taking differently: Take 75 mg by mouth once daily), Disp: 30 capsule, Rfl: 5 .  semaglutide (OZEMPIC) 2 mg/dose (8 mg/3 mL) pen injector, Inject 0.75 mLs (2 mg total) subcutaneously once a week, Disp: 9 mL, Rfl: 1 .  testosterone cypionate (DEPO-TESTOSTERONE) 200 mg/mL injection, Inject 0.6 mLs (120 mg total) into the muscle every 14 (fourteen) days, Disp: 8 mL, Rfl: 1 .  traZODone  (DESYREL ) 50 MG tablet, TAKE 1 TO 2 TABLETS BY MOUTH AT BEDTIME AS NEEDED FOR SLEEP, Disp: 180 tablet, Rfl: 1 .  MAGNESIUM  ORAL, Take 1 tablet by mouth once daily, Disp: , Rfl:   Vitals:   03/30/24 0900  BP: 127/71  Pulse: 87   Body mass index is 39.17 kg/m. No acute distress Lungs; clear to ascultation Heart; Regular rate and rhythm  Abdomen; Soft and flat, normal bowel sounds Extremities; No clubbing, cyanosis or edema  No visits with results within 3 Month(s) from this visit.  Latest known visit with results is:  Appointment on 11/30/2023  Component Date Value Ref Range Status  . Hemoglobin A1C 11/30/2023 5.8 (H)  4.2 - 5.6 % Final  . Average Blood Glucose (Calc) 11/30/2023 120  mg/dL Final  . Creatinine, Random Urine 11/30/2023 98.0  40.0 - 300.0 mg/dL Final  . Urine Albumin, Random 11/30/2023 <7    mg/L Final  . Urine Albumin/Creatinine  Ratio 11/30/2023 <7.1  <30.0 ug/mg Final  . Glucose 11/30/2023 102  70 - 110 mg/dL Final  . Sodium 97/82/7974 140  136 - 145 mmol/L Final  . Potassium 11/30/2023 4.5  3.6 - 5.1 mmol/L Final  . Chloride 11/30/2023 105  97 - 109 mmol/L Final  . Carbon Dioxide (CO2) 11/30/2023 25.6  22.0 - 32.0 mmol/L Final  . Urea Nitrogen (BUN) 11/30/2023 24  7 - 25 mg/dL Final  . Creatinine 97/82/7974 1.2  0.7 - 1.3 mg/dL Final  . Glomerular Filtration Rate (eGFR) 11/30/2023 64  >60 mL/min/1.73sq m Final  . Calcium  11/30/2023 9.6  8.7 - 10.3 mg/dL Final  . AST  97/82/7974 24  8 - 39 U/L Final  . ALT  11/30/2023 29  6 - 57 U/L Final  . Alk Phos (alkaline Phosphatase) 11/30/2023 61  34 - 104 U/L Final  . Albumin 11/30/2023 4.4  3.5 - 4.8 g/dL Final  . Bilirubin, Total 11/30/2023 0.4  0.3 - 1.2 mg/dL Final  . Protein, Total 11/30/2023 6.6  6.1 - 7.9 g/dL Final  . A/G Ratio 97/82/7974 2.0  1.0 - 5.0 gm/dL Final  . Testosterone, Total, LC/MS - [facility]* 11/30/2023 281.9  264.0 - 916.0 ng/dL Final  . WBC (White Blood Cell Count) 11/30/2023 7.5  4.1 - 10.2 10^3/uL Final  . RBC (Red Blood Cell Count) 11/30/2023 4.13 (L)  4.69 - 6.13 10^6/uL Final  . Hemoglobin 11/30/2023 12.5 (L)  14.1 - 18.1 gm/dL Final  . Hematocrit 97/82/7974 39.2 (L)  40.0 - 52.0 % Final  . MCV (Mean Corpuscular Volume) 11/30/2023 94.9  80.0 - 100.0 fl Final  . MCH (Mean Corpuscular Hemoglobin) 11/30/2023 30.3  27.0 - 31.2 pg Final  . MCHC (Mean Corpuscular Hemoglobin * 11/30/2023 31.9 (L)  32.0 - 36.0 gm/dL Final  . Platelet Count 11/30/2023 332  150 - 450 10^3/uL Final  . RDW-CV (Red Cell Distribution Widt* 11/30/2023 14.9 (H)  11.6 - 14.8 % Final  . MPV (Mean Platelet Volume) 11/30/2023 9.8  9.4 - 12.4 fl Final  . Vitamin B12 11/30/2023 >1,500  >300 pg/mL Final  . Folate (Folic Acid) 11/30/2023 15.2  >4.0 ng/mL Final  . Iron  11/30/2023 67  45 - 182 ug/dL Final  . Total Iron  Binding Capacity (TIBC) 11/30/2023 467.9  261.0 - 478.0 ug/dL  Final  . Transferrin 97/82/7974 334.2  203.0 - 362.0 mg/dL Final  . % Saturation 97/82/7974 14  % Final  . Ferritin 11/30/2023 9 (L)  23 - 336 ng/mL Final    ASSESSMENT  AND PLAN:  Diagnoses and all orders for this visit:  Essential hypertension Assessment & Plan: Taking medications without noted side effects or dizziness.    Orders: -     Lipid Panel w/calc LDL; Future -     Microalbumin/Creatinine Ratio, Random Urine; Future  Prediabetes  Obstructive sleep apnea syndrome  Major depression in remission () Assessment & Plan: Mood is generally doing well    Hypercholesterolemia Assessment & Plan: Healthy fat diet is being followed and no myalgia's are noted.   Orders: -     Lipid Panel w/calc LDL; Future -     Microalbumin/Creatinine Ratio, Random Urine; Future  Pulsatile abdominal mass -     US  abdominal aorta; Future  Abdominal pain, unspecified abdominal location -     US  abdominal aorta; Future  Spinal stenosis of lumbar region with neurogenic claudication -     Ambulatory Referral to Physiatrist

## 2024-04-05 ENCOUNTER — Other Ambulatory Visit: Payer: Self-pay | Admitting: Internal Medicine

## 2024-04-05 DIAGNOSIS — R109 Unspecified abdominal pain: Secondary | ICD-10-CM

## 2024-04-05 DIAGNOSIS — R1900 Intra-abdominal and pelvic swelling, mass and lump, unspecified site: Secondary | ICD-10-CM

## 2024-04-07 NOTE — Progress Notes (Signed)
 HPI The patient is a pleasant 73 year old gentleman who presents today for bilateral low back pain with a feeling of numbness and tingling in the anterior thigh.  He describes his pain as moderate that is sharp intermittent and he notes numbness and tingling when standing for a few minutes and this is alleviated when sitting.  In the past he was followed for right buttock, right lateral hip, right groin and anterior lateral thigh stopping at the knee.  He since has under gone right hip replacement and lumbar spine fusion.  He describes good relief of groin pain but did not notice significant improvement following fusion in 2022.  Medications have included lyrica and tylenol  with mild relief.  He has a history of colon cancer and type 2 diabetes.  He and his wife moved from [place]  to be closer to their daughter in [place].  He continues with Lyrica.  The [place]  Narcotic Database was reviewed today.   Procedures: 12/04/2022: right hip replacement  09/17/2021: L3-4, L4-5 fusion Dr. Pascal 05/28/2021: Right L3-4 transforaminal ESI (20% relief)   Past Medical History:  Diagnosis Date  . AKI (acute kidney injury) () 04/29/2022  . Barrett's esophagus   . Bilateral sciatica 09/07/2021   Bilateral lumbar radiculopathy right greater than left related to lumbar spinal stenosis L2-3, L3-4, L4-5.  [name] Carcinoma of the skin, basal cell   . Colon cancer (CMS/HHS-HCC)   . Depression   . Gastroesophageal reflux disease with esophagitis 09/07/2021  . History of malignant neoplasm of colon 09/07/2021   Status post laparoscopic ileectomy for colon cancer 2005.  [name] Hyperlipidemia   . Hypertension   . Low testosterone   . Lumbar degenerative disc disease 09/07/2021   Multiple level lumbar degenerative disc disease most pronounced L3-4 L4-5 associated with degenerative spondylolisthesis at both levels.  . Melanoma in situ of back (CMS/HHS-HCC) 09/07/2021   History of melanoma of  the back excised 2017.  . Obesity, morbid (CMS/HHS-HCC) 01/20/2020  . Primary osteoarthritis of right hip 09/15/2021  . Skin cancer (melanoma) (CMS/HHS-HCC)   . Sleep apnea   . Spinal stenosis of lumbar region with neurogenic claudication 09/07/2021   Lumbar spinal stenosis L2-3, L3-4, L4-5, L5-S1, with bilateral neurogenic claudication right greater than left.  [name] Spondylolisthesis at L4-L5 level 09/07/2021   Grade 1 degenerative spondylolisthesis L3-4, grade 1 degenerative spondylolisthesis L4-5 with lumbar spinal stenosis, right greater than left neurogenic claudication/lumbar radiculopathy.  . Type 2 diabetes mellitus (CMS/HHS-HCC)     Past Surgical History:  Procedure Laterality Date  . COLON SURGERY  2005   laparoscopic ileectomy for colon ca  . COLONOSCOPY  05/09/2020   Diverticulosis/Otherwise normal/Repeat 110yrs/CTL  . EGD  05/09/2020   Barrett's Esophagus/Repeat 40yrs/CTL  . PERCUTANEOUS SPINAL FUSION Bilateral 09/17/2021   Procedure: BILATERAL LUMBAR DECOMPRESSION L2-3, L3-4, L4-5, SPINAL FUSION L3-4, L4-5. LOCAL AUTOGRAFT/ALLOGRAFT;  Surgeon: Derian, Thomas Craig, MD;  Location: [facility] OR;  Service: Orthopedics;  Laterality: Bilateral;  . INSTRUMENTATION POSTERIOR SPINE 7 TO 12 VERTEBRAL SEGMENTS Bilateral 09/17/2021   Procedure: POSTERIOR SEGMENTAL INSTRUMENTATION (EG, PEDICLE FIXATION, DUAL RODS WITH MULTIPLE HOOKS AND SUBLAMINAR WIRES); 3 TO 6 VERTEBRAL SEGMENTS (LIST IN ADDITION TO PRIMARY PROCEDURE);  Surgeon: Pascal Debby Banks, MD;  Location: [facility] OR;  Service: Orthopedics;  Laterality: Bilateral;  . LAMINECTOMY POSTERIOR LUMBAR FACETECTOMY & FORAMINOTOMY W/DECOMP Bilateral 09/17/2021   Procedure: LAMINECTOMY, FACETECTOMY AND FORAMINOTOMY (UNILATERAL OR BILATERAL WITH DECOMPRESSION OF SPINAL CORD, CAUDA EQUINA AND/OR NERVE ROOT(S), SINGLE VERTEBRAL SEGMENT; LUMBAR;  Surgeon: Derian,  Debby Banks, MD;  Location: [facility] OR;  Service: Orthopedics;  Laterality: Bilateral;  .  LAMINECTOMY POSTERIOR CERVICLE DECOMP W/FACETECTOMY & FORAMINOTOMY Bilateral 09/17/2021   Procedure: LAMINECTOMY, FACETECTOMY AND FORAMINOTOMY, SINGLE VERTEBRAL SEGMENT; EACH ADDITIONAL vertebral SEGMENT, CERVICAL, THORACIC, OR LUMBAR (LIST IN ADDITION TO PRIMARY PROCEDURE);  Surgeon: Pascal Debby Banks, MD;  Location: [facility] OR;  Service: Orthopedics;  Laterality: Bilateral;  . ARTHRODESIS POSTERIOR SPINE Bilateral 09/17/2021   Procedure: ARTHRODESIS, POSTERIOR OR POSTEROLATERAL TECHNIQUE, SINGLE interspace; EACH ADDITIONAL (LIST IN ADDITION TO PRIMARY PROCEDURE);  Surgeon: Pascal Debby Banks, MD;  Location: [facility] OR;  Service: Orthopedics;  Laterality: Bilateral;  . ARTHROPLASTY HIP TOTAL Right 12/04/2022   Procedure: TOTAL HIP ARTHROPLASTY;  Surgeon: Taft Jayson Lenis, MD;  Location: [facility] OR;  Service: Orthopedics;  Laterality: Right;  . Colon @ [facility]  12/09/2023   Tubular adenomas/PHx CP/Repeat 67yrs/SMR  . EGD @ [facility]  12/09/2023   Barrett's Esophagus/Repeat 36yrs/SMR  . ARTHROSCOPY SHOULDER Right    rotator cuff repair  . BACK SURGERY  09/17/2021   Lumbar Spine Fusion  . Dequervein's release Left   . LAPAROSCOPIC COLON RESECTION    . melanoma removal     2016  . VASECTOMY  1985    Social History   Socioeconomic History  . Marital status: Married  Tobacco Use  . Smoking status: Never  . Smokeless tobacco: Never  Vaping Use  . Vaping status: Never Used  Substance and Sexual Activity  . Alcohol use: Yes    Alcohol/week: 3.0 standard drinks of alcohol    Types: 3 Standard drinks or equivalent per week  . Drug use: Never  . Sexual activity: Not Currently    Partners: Female    Birth control/protection: None   Social Drivers of Health   Financial Resource Strain: Low Risk  (09/24/2023)   Overall Financial Resource Strain (CARDIA)   . Difficulty of Paying Living Expenses: Not hard at all  Food Insecurity: No Food Insecurity (12/30/2023)   [facility]   Hunger  Vital Sign   . Within the past 12 months, you worried that your food would run out before you got the money to buy more.: Never true   . Within the past 12 months, the food you bought just didn't last and you didn't have money to get more.: Never true  Transportation Needs: No Transportation Needs (12/30/2023)   Received from [facility] - Transportation   . Lack of Transportation (Medical): No   . Lack of Transportation (Non-Medical): No    Family History  Problem Relation Name Age of Onset  . Heart disease Mother Danyael Alipio   . High blood pressure (Hypertension) Mother Keylin Podolsky   . Lymphoma Father Odus   . Skin cancer Father Odus   . Leukemia Father Odus   . No Known Problems Brother    . High blood pressure (Hypertension) Maternal Grandmother Bethel T   . Diabetes type II Paternal Grandfather AO Askari     Current Outpatient Medications on File Prior to Visit  Medication Sig Dispense Refill  . acetaminophen  (TYLENOL ) 500 MG tablet Take 1,000 mg by mouth at bedtime    . amLODIPine  (NORVASC ) 5 MG tablet TAKE 1 TABLET BY MOUTH EVERY DAY 90 tablet 3  . atorvastatin  (LIPITOR) 20 MG tablet TAKE 1 TABLET BY MOUTH EVERY DAY 90 tablet 3  . cholecalciferol (VITAMIN D3) 1000 unit tablet Take 1 tablet (1,000 Units total) by mouth once daily    .  cyanocobalamin  (VITAMIN B12) 1000 MCG tablet Take 1 tablet (1,000 mcg total) by mouth once daily    . escitalopram  oxalate (LEXAPRO ) 20 MG tablet TAKE 1 TABLET BY MOUTH EVERY DAY 90 tablet 1  . ferrous sulfate (IRON ) 325 (65 FE) MG tablet Take 325 mg by mouth daily with breakfast    . losartan  (COZAAR ) 100 MG tablet TAKE 1 TABLET (100 MG TOTAL) BY MOUTH ONCE DAILY DO NOT TAKE THIS MEDICATION UNTIL YOU ARE INSTRUCTED TO DO SO BY YOUR DOCTOR. 90 tablet 3  . MAGNESIUM  CITRATE ORAL Take 250 mg by mouth 2 (two) times daily    . metFORMIN (GLUCOPHAGE-XR) 500 MG XR tablet TAKE 2 TABLETS BY MOUTH TWICE A DAY 360 tablet 1  .  multivit-min-folic-vit K-lycop (ONE-A-DAY MEN'S 50 PLUS) 400-20-370 mcg Tab Take 1 tablet by mouth once daily    . omega-3 fatty acids/fish oil (OMEGA 3 FISH OIL ORAL) Take 1 capsule by mouth once daily    . omeprazole (PRILOSEC) 20 MG DR capsule take 1 capsule by mouth every day 90 capsule 3  . pregabalin (LYRICA) 75 MG capsule Take 1 capsule (75 mg total) by mouth 2 (two) times daily (Patient taking differently: Take 75 mg by mouth once daily) 30 capsule 5  . semaglutide (OZEMPIC) 2 mg/dose (8 mg/3 mL) pen injector Inject 0.75 mLs (2 mg total) subcutaneously once a week 9 mL 1  . testosterone cypionate (DEPO-TESTOSTERONE) 200 mg/mL injection Inject 0.6 mLs (120 mg total) into the muscle every 14 (fourteen) days 8 mL 1  . traZODone  (DESYREL ) 50 MG tablet TAKE 1 TO 2 TABLETS BY MOUTH AT BEDTIME AS NEEDED FOR SLEEP 180 tablet 1  . MAGNESIUM  ORAL Take 1 tablet by mouth once daily     No current facility-administered medications on file prior to visit.    Allergies as of 04/07/2024  . (No Known Allergies)    ROS More than 10 system, review of system form was given to the patient to fill out and has been signed by [employer] FNP-C and scanned into the patient's chart.   Vital signs Vitals:   04/07/24 1041  BP: 111/73  Pulse: 76  Temp: 36.3 C (97.4 F)  TempSrc: Oral  SpO2: 96%  Weight: (!) 123.8 kg (273 lb)  Height: 177.8 cm (5' 10)  PainSc:   4  PainLoc: Back   Exam General: Alert oriented morbidly obese no distress  Lumbosacral Exam (performed 04/07/2024) Upon inspection there are no rashes he has a well-healed midline surgical scar.  Upon palpation he is without notable tenderness to the lumbosacral paraspinal musculature.  Extension rotation is very limited producing low back pain.  Lower Extremity Exam He has 5/5 strength in bilateral dorsiflexors, knee extensors, hip flexors with giveaway weakness on the right.  Sensation intact to light touch bilaterally.  Unable to  elicit bilateral patellar and Achilles reflexes.  No ankle clonus.  Straight leg raise is negative bilaterally.  Full range motion to bilateral hip joints without notable pain elicited in the groin.  Radiographic Data Lumbar spine x-rays from [facility] clinic dated 04/07/2024 imaging reviewed awaiting official radiology report.  Impression 1.  Acute on chronic right-sided low back pain with radiating pain to the right buttock, right lateral hip, right groin and anterior lateral thigh stopping at the knee.  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 MRI lumbar spine without contrast from [facility] imaging dated 07/10/2021 imaging and  report reviewed today.  L5-S1 central disc bulging with severe bilateral facet arthropathy and edema.  Severe left and moderate right foraminal stenosis.  L4-5 central disc bulging with severe bilateral facet arthropathy.  Moderate bilateral foraminal stenosis.  Angulation of the canal with mild central stenosis.  L3-4 central disc bulging with severe bilateral facet arthropathy and ligamentum flavum hypertrophy.  There is mild bilateral foraminal stenosis and moderate central stenosis.  L2-3 disc bulging with moderate bilateral facet arthropathy and mild to moderate central stenosis.  No foraminal stenosis.  L1-2 broad-based disc bulging with moderate bilateral facet arthropathy and ligamentum flavum hypertrophy.  Mild central stenosis with mild left-sided foraminal stenosis.  L3-4 and L4-5 fusion by Dr. Derian in December 2022  2.  Previous right total hip replacement in 2024  3.  Hypertension, type 2 diabetes, elevated creatinine  4. Anemia  5. Type 2 diabetes, last A1c on 11/30/2023 was 5.8.  Plan 1.  We will avoid NSAIDs with patient recent elevated creatinine. 2.  Continue with tylenol  daily as needed  3.  Continue with Lyrica 75 mg twice daily.  He does not  tolerate gabapentin.  He did not tolerate prednisone. 5.  Lumbar spine x-rays obtained and discussed in office today. 6.  Order placed for MRI of the lumbar spine without contrast. 7.  He will follow-up with me after MRI is completed for discussion of findings and next steps.    I personally performed the service, non-incident to. (WP)   WHITNEY MEELER, NP    BENTON DOWSE, NP  This note was generated in part with voice recognition software and I apologize for any typographical errors that were not detected and corrected.  PCP: Dr. Lenon

## 2024-04-08 ENCOUNTER — Other Ambulatory Visit: Payer: Self-pay | Admitting: Family Medicine

## 2024-04-08 DIAGNOSIS — M5416 Radiculopathy, lumbar region: Secondary | ICD-10-CM

## 2024-04-11 ENCOUNTER — Ambulatory Visit
Admission: RE | Admit: 2024-04-11 | Discharge: 2024-04-11 | Disposition: A | Discharge status: Home / Self Care | Source: Ambulatory Visit | Attending: Internal Medicine | Admitting: Internal Medicine

## 2024-04-11 DIAGNOSIS — R109 Unspecified abdominal pain: Secondary | ICD-10-CM | POA: Insufficient documentation

## 2024-04-11 DIAGNOSIS — R1900 Intra-abdominal and pelvic swelling, mass and lump, unspecified site: Secondary | ICD-10-CM | POA: Diagnosis present

## 2024-04-18 NOTE — Patient Instructions (Signed)
 You are being treated for a sinus infection and bronchitis. You are being prescribed antibiotics, please take as directed. Prednisone for inflammation. Benzonatate for cough relief. Warm compresses, nasal saline washes, steam showers, warm salt water gargles for further symptomatic relief. Tylenol  per package instructions for pain relief. Follow up with PCP if symptoms persist despite treatment. Go to ED if develop chest pain, shortness of breath, sudden/severe headache, neurologic symptoms, changes in vision, neck stiffness.   This appears to be conjunctivitis. This can be caused by allergies vs. Viruses vs. Bacteria.  Please take antibiotic drops as directed for possible bacterial source.  Practice good hand hygeine - avoid touching eye, wash hands regularly, do no share towels or pillow cases. Cool compresses. Wash lids gently with warm water and baby shampoo. Avoid close contact for 24 hours.  Follow up with eye doctor if no improvement in 2-3 days.  Go to ED if develop sudden/severe eye pain, changes in vision, unable to move eye, headache, nausea, vomiting.

## 2024-04-18 NOTE — Progress Notes (Signed)
 Chief Complaint:   Chief Complaint  Patient presents with  . Generalized Body Aches    X 2 weeks  Patient reports body aches, chills, productive cough, nasal congestion, sore throat, left eye drainage  Denies N/V/D, fever    Subjective:   Kyle Washington is a 73 y.o. male established patient in today for:  HPI patient presents to clinic with complaint of congestion and cough. Onset of sxs about two weeks ago. Symptoms worsened over the weekend. Complains of myalgias, fatigue, slight post nasal drip, nasal congestion with green secretions, scratchy throat, sinus pressure, productive cough with green phlegm, headache, and some dizziness with position changes or moving head too quickly. Also complains of left eye drainage. Drainage is yellow and sticky. Some eye redness. Some blurry vision. No eye trauma or chemical exposure. Wears glasses. No contact lens. Denies double vision. He has been having some constipation and bowel pains, h/o colon cancer s/p ileectomy. States he typically has looser bowel movements. Has had to strain and has had smaller bowel movements. Has been taking miralax and dulcolax. Reports good fluid intake. Denies nausea or vomiting. No urinary sxs. Gets tingling/numbness in his hands from time to time - this has been ongoing for a few months. No chest pain, dyspnea,  facial droop, slurred speech, loss of consciousness. Took at home covid-19 test last week x 2 negative.    Past Medical History:  Diagnosis Date  . AKI (acute kidney injury) () 04/29/2022  . Barrett's esophagus   . Bilateral sciatica 09/07/2021   Bilateral lumbar radiculopathy right greater than left related to lumbar spinal stenosis L2-3, L3-4, L4-5.  [name] Carcinoma of the skin, basal cell   . Colon cancer (CMS/HHS-HCC)   . Depression   . Gastroesophageal reflux disease with esophagitis 09/07/2021  . History of malignant neoplasm of colon 09/07/2021   Status post laparoscopic ileectomy for colon cancer 2005.   [name] Hyperlipidemia   . Hypertension   . Low testosterone   . Lumbar degenerative disc disease 09/07/2021   Multiple level lumbar degenerative disc disease most pronounced L3-4 L4-5 associated with degenerative spondylolisthesis at both levels.  . Melanoma in situ of back (CMS/HHS-HCC) 09/07/2021   History of melanoma of the back excised 2017.  . Obesity, morbid (CMS/HHS-HCC) 01/20/2020  . Primary osteoarthritis of right hip 09/15/2021  . Skin cancer (melanoma) (CMS/HHS-HCC)   . Sleep apnea   . Spinal stenosis of lumbar region with neurogenic claudication 09/07/2021   Lumbar spinal stenosis L2-3, L3-4, L4-5, L5-S1, with bilateral neurogenic claudication right greater than left.  [name] Spondylolisthesis at L4-L5 level 09/07/2021   Grade 1 degenerative spondylolisthesis L3-4, grade 1 degenerative spondylolisthesis L4-5 with lumbar spinal stenosis, right greater than left neurogenic claudication/lumbar radiculopathy.  . Type 2 diabetes mellitus (CMS/HHS-HCC)     Past Surgical History:  Procedure Laterality Date  . COLON SURGERY  2005   laparoscopic ileectomy for colon ca  . COLONOSCOPY  05/09/2020   Diverticulosis/Otherwise normal/Repeat 68yrs/CTL  . EGD  05/09/2020   Barrett's Esophagus/Repeat 21yrs/CTL  . PERCUTANEOUS SPINAL FUSION Bilateral 09/17/2021   Procedure: BILATERAL LUMBAR DECOMPRESSION L2-3, L3-4, L4-5, SPINAL FUSION L3-4, L4-5. LOCAL AUTOGRAFT/ALLOGRAFT;  Surgeon: Derian, Thomas Craig, MD;  Location: [facility] OR;  Service: Orthopedics;  Laterality: Bilateral;  . INSTRUMENTATION POSTERIOR SPINE 7 TO 12 VERTEBRAL SEGMENTS Bilateral 09/17/2021   Procedure: POSTERIOR SEGMENTAL INSTRUMENTATION (EG, PEDICLE FIXATION, DUAL RODS WITH MULTIPLE HOOKS AND SUBLAMINAR WIRES); 3 TO 6 VERTEBRAL SEGMENTS (LIST IN ADDITION TO PRIMARY PROCEDURE);  Surgeon: Derian,  Debby Banks, MD;  Location: [facility] OR;  Service: Orthopedics;  Laterality: Bilateral;  . LAMINECTOMY POSTERIOR LUMBAR FACETECTOMY & FORAMINOTOMY  W/DECOMP Bilateral 09/17/2021   Procedure: LAMINECTOMY, FACETECTOMY AND FORAMINOTOMY (UNILATERAL OR BILATERAL WITH DECOMPRESSION OF SPINAL CORD, CAUDA EQUINA AND/OR NERVE ROOT(S), SINGLE VERTEBRAL SEGMENT; LUMBAR;  Surgeon: Pascal Debby Banks, MD;  Location: [facility] OR;  Service: Orthopedics;  Laterality: Bilateral;  . LAMINECTOMY POSTERIOR CERVICLE DECOMP W/FACETECTOMY & FORAMINOTOMY Bilateral 09/17/2021   Procedure: LAMINECTOMY, FACETECTOMY AND FORAMINOTOMY, SINGLE VERTEBRAL SEGMENT; EACH ADDITIONAL vertebral SEGMENT, CERVICAL, THORACIC, OR LUMBAR (LIST IN ADDITION TO PRIMARY PROCEDURE);  Surgeon: Pascal Debby Banks, MD;  Location: [facility] OR;  Service: Orthopedics;  Laterality: Bilateral;  . ARTHRODESIS POSTERIOR SPINE Bilateral 09/17/2021   Procedure: ARTHRODESIS, POSTERIOR OR POSTEROLATERAL TECHNIQUE, SINGLE interspace; EACH ADDITIONAL (LIST IN ADDITION TO PRIMARY PROCEDURE);  Surgeon: Pascal Debby Banks, MD;  Location: [facility] OR;  Service: Orthopedics;  Laterality: Bilateral;  . ARTHROPLASTY HIP TOTAL Right 12/04/2022   Procedure: TOTAL HIP ARTHROPLASTY;  Surgeon: Taft Jayson Lenis, MD;  Location: [facility] OR;  Service: Orthopedics;  Laterality: Right;  . Colon @ [facility]  12/09/2023   Tubular adenomas/PHx CP/Repeat 14yrs/SMR  . EGD @ [facility]  12/09/2023   Barrett's Esophagus/Repeat 29yrs/SMR  . ARTHROSCOPY SHOULDER Right    rotator cuff repair  . BACK SURGERY  09/17/2021   Lumbar Spine Fusion  . Dequervein's release Left   . LAPAROSCOPIC COLON RESECTION    . melanoma removal     2016  . VASECTOMY  1985    Outpatient Medications Prior to Visit  Medication Sig Dispense Refill  . acetaminophen  (TYLENOL ) 500 MG tablet Take 1,000 mg by mouth at bedtime    . amLODIPine  (NORVASC ) 5 MG tablet TAKE 1 TABLET BY MOUTH EVERY DAY 90 tablet 3  . atorvastatin  (LIPITOR) 20 MG tablet TAKE 1 TABLET BY MOUTH EVERY DAY 90 tablet 3  . cholecalciferol (VITAMIN D3) 1000 unit tablet Take 1 tablet (1,000 Units total) by  mouth once daily    . cyanocobalamin  (VITAMIN B12) 1000 MCG tablet Take 1 tablet (1,000 mcg total) by mouth once daily    . escitalopram  oxalate (LEXAPRO ) 20 MG tablet TAKE 1 TABLET BY MOUTH EVERY DAY 90 tablet 1  . ferrous sulfate (IRON ) 325 (65 FE) MG tablet Take 325 mg by mouth daily with breakfast    . losartan  (COZAAR ) 100 MG tablet TAKE 1 TABLET (100 MG TOTAL) BY MOUTH ONCE DAILY DO NOT TAKE THIS MEDICATION UNTIL YOU ARE INSTRUCTED TO DO SO BY YOUR DOCTOR. 90 tablet 3  . MAGNESIUM  CITRATE ORAL Take 250 mg by mouth 2 (two) times daily    . metFORMIN (GLUCOPHAGE-XR) 500 MG XR tablet TAKE 2 TABLETS BY MOUTH TWICE A DAY 360 tablet 1  . multivit-min-folic-vit K-lycop (ONE-A-DAY MEN'S 50 PLUS) 400-20-370 mcg Tab Take 1 tablet by mouth once daily    . omega-3 fatty acids/fish oil (OMEGA 3 FISH OIL ORAL) Take 1 capsule by mouth once daily    . omeprazole (PRILOSEC) 20 MG DR capsule take 1 capsule by mouth every day 90 capsule 3  . pregabalin (LYRICA) 75 MG capsule Take 1 capsule (75 mg total) by mouth once daily 30 capsule 5  . semaglutide (OZEMPIC) 2 mg/dose (8 mg/3 mL) pen injector Inject 0.75 mLs (2 mg total) subcutaneously once a week 9 mL 1  . testosterone cypionate (DEPO-TESTOSTERONE) 200 mg/mL injection Inject 0.6 mLs (120 mg total) into the muscle every 14 (fourteen) days 8 mL 1  .  traZODone  (DESYREL ) 50 MG tablet TAKE 1 TO 2 TABLETS BY MOUTH AT BEDTIME AS NEEDED FOR SLEEP 180 tablet 1  . MAGNESIUM  ORAL Take 1 tablet by mouth once daily     No facility-administered medications prior to visit.    No Known Allergies  Family History  Problem Relation Name Age of Onset  . Heart disease Mother Maninder Deboer   . High blood pressure (Hypertension) Mother Lakeem Rozo   . Lymphoma Father Odus   . Skin cancer Father Odus   . Leukemia Father Odus   . No Known Problems Brother    . High blood pressure (Hypertension) Maternal Grandmother Bethel T   . Diabetes type II Paternal Grandfather AO  [place]     Social History   Tobacco Use  . Smoking status: Never  . Smokeless tobacco: Never  Vaping Use  . Vaping status: Never Used  Substance Use Topics  . Alcohol use: Yes    Alcohol/week: 3.0 standard drinks of alcohol    Types: 3 Standard drinks or equivalent per week  . Drug use: Never      Review of Systems  Pertinent positive and negative ROS as documented in HPI.    Objective:   Blood pressure 99/58, pulse 70, temperature 36.8 C (98.3 F), temperature source Oral, height 180.3 cm (5' 11), weight (!) 121.6 kg (268 lb), SpO2 97%.  Physical Exam Vitals and nursing note reviewed.  Constitutional:      General: He is not in acute distress.    Appearance: Normal appearance. He is not ill-appearing, toxic-appearing or diaphoretic.  HENT:     Head: Normocephalic and atraumatic.     Right Ear: Tympanic membrane and ear canal normal.     Left Ear: Tympanic membrane and ear canal normal.     Nose: Congestion present.     Mouth/Throat:     Mouth: Mucous membranes are moist.     Pharynx: Oropharynx is clear. No oropharyngeal exudate or posterior oropharyngeal erythema.     Comments: No trismus. Uvula midline. Managing secretions.   Eyes:     General: No scleral icterus.       Right eye: No discharge.        Left eye: Discharge present.    Pupils: Pupils are equal, round, and reactive to light.     Comments: Slight left conjunctival injection. Yellow eye discharge.   Cardiovascular:     Rate and Rhythm: Normal rate and regular rhythm.     Heart sounds: Normal heart sounds.  Pulmonary:     Effort: Pulmonary effort is normal. No respiratory distress.     Breath sounds: Normal breath sounds. No wheezing, rhonchi or rales.     Comments: Respiratory exam as documented. Abdominal:     General: Bowel sounds are normal. There is no distension.     Palpations: Abdomen is soft.     Tenderness: There is no abdominal tenderness. There is no guarding or rebound.      Comments: Gastrointestinal exam as documented above.    Musculoskeletal:     Cervical back: Normal range of motion and neck supple.  Lymphadenopathy:     Cervical: No cervical adenopathy.   Skin:    General: Skin is warm and dry.   Neurological:     Mental Status: He is alert.   Psychiatric:        Mood and Affect: Mood normal.        Behavior: Behavior normal.     No  results found for this visit on 04/18/24.   Assessment/Plan:   1. Acute bacterial sinusitis -     amoxicillin-clavulanate (AUGMENTIN) 875-125 mg tablet; Take 1 tablet (875 mg total) by mouth 2 (two) times daily for 10 days  Dispense: 20 tablet; Refill: 0 -     predniSONE (DELTASONE) 20 MG tablet; Take 1 tablet (20 mg total) by mouth once daily for 5 days  Dispense: 5 tablet; Refill: 0  2. Bronchitis -     predniSONE (DELTASONE) 20 MG tablet; Take 1 tablet (20 mg total) by mouth once daily for 5 days  Dispense: 5 tablet; Refill: 0 -     benzonatate (TESSALON) 200 MG capsule; Take 1 capsule (200 mg total) by mouth 3 (three) times daily as needed for Cough for up to 7 days  Dispense: 21 capsule; Refill: 0  3. Conjunctivitis of left eye, unspecified conjunctivitis type -     trimethoprim-polymyxin b (POLYTRIM) ophthalmic solution; Place 1 drop into the left eye every 6 (six) hours for 7 days  Dispense: 10 mL; Refill: 0   Patient is pleasant, cooperative, in no apparent distress. Vitals are okay - recheck BP was 124/62. No respiratory distress. Lungs clear. Benign abdominal exam. Can continue with miralax and hydrate for bowel movements. Suspect sinusitis and bronchitis. Given duration of sxs with worsening suspect bacterial etiology. Will rx abx. Prednisone for inflammation. Benzonatate for cough. Also with left conjunctivitis. Rx topical abx drops. Further symptomatic treatment discussed. Questions addressed. Follow up with PCP if no improvement or new/worsening symptoms. ED precautions provided in patient d/c  instructions. Stable for discharge.    Attestation Statement:   I personally performed the service, non-incident to. (WP)   ASHLEY BRIDGETTE POISSON, PA      Patient Instructions  You are being treated for a sinus infection and bronchitis. You are being prescribed antibiotics, please take as directed. Prednisone for inflammation. Benzonatate for cough relief. Warm compresses, nasal saline washes, steam showers, warm salt water gargles for further symptomatic relief. Tylenol  per package instructions for pain relief. Follow up with PCP if symptoms persist despite treatment. Go to ED if develop chest pain, shortness of breath, sudden/severe headache, neurologic symptoms, changes in vision, neck stiffness.   This appears to be conjunctivitis. This can be caused by allergies vs. Viruses vs. Bacteria.  Please take antibiotic drops as directed for possible bacterial source.  Practice good hand hygeine - avoid touching eye, wash hands regularly, do no share towels or pillow cases. Cool compresses. Wash lids gently with warm water and baby shampoo. Avoid close contact for 24 hours.  Follow up with eye doctor if no improvement in 2-3 days.  Go to ED if develop sudden/severe eye pain, changes in vision, unable to move eye, headache, nausea, vomiting.

## 2024-06-20 ENCOUNTER — Ambulatory Visit
Admission: RE | Admit: 2024-06-20 | Discharge: 2024-06-20 | Disposition: A | Discharge status: Home / Self Care | Source: Ambulatory Visit | Attending: Family Medicine | Admitting: Family Medicine

## 2024-06-20 DIAGNOSIS — M5416 Radiculopathy, lumbar region: Secondary | ICD-10-CM

## 2024-07-12 ENCOUNTER — Inpatient Hospital Stay: Attending: Internal Medicine

## 2024-07-12 DIAGNOSIS — D649 Anemia, unspecified: Secondary | ICD-10-CM | POA: Diagnosis present

## 2024-07-12 LAB — CBC WITH DIFFERENTIAL (CANCER CENTER ONLY)
Abs Immature Granulocytes: 0.03 K/uL (ref 0.00–0.07)
Basophils Absolute: 0.1 K/uL (ref 0.0–0.1)
Basophils Relative: 1 %
Eosinophils Absolute: 0.4 K/uL (ref 0.0–0.5)
Eosinophils Relative: 6 %
HCT: 41.4 % (ref 39.0–52.0)
Hemoglobin: 13.5 g/dL (ref 13.0–17.0)
Immature Granulocytes: 0 %
Lymphocytes Relative: 31 %
Lymphs Abs: 2.1 K/uL (ref 0.7–4.0)
MCH: 31.3 pg (ref 26.0–34.0)
MCHC: 32.6 g/dL (ref 30.0–36.0)
MCV: 95.8 fL (ref 80.0–100.0)
Monocytes Absolute: 0.7 K/uL (ref 0.1–1.0)
Monocytes Relative: 10 %
Neutro Abs: 3.6 K/uL (ref 1.7–7.7)
Neutrophils Relative %: 52 %
Platelet Count: 303 K/uL (ref 150–400)
RBC: 4.32 MIL/uL (ref 4.22–5.81)
RDW: 13.4 % (ref 11.5–15.5)
WBC Count: 6.9 K/uL (ref 4.0–10.5)
nRBC: 0 % (ref 0.0–0.2)

## 2024-07-12 LAB — BASIC METABOLIC PANEL - CANCER CENTER ONLY
Anion gap: 9 (ref 5–15)
BUN: 26 mg/dL — ABNORMAL HIGH (ref 8–23)
CO2: 24 mmol/L (ref 22–32)
Calcium: 9.2 mg/dL (ref 8.9–10.3)
Chloride: 104 mmol/L (ref 98–111)
Creatinine: 1.27 mg/dL — ABNORMAL HIGH (ref 0.61–1.24)
GFR, Estimated: >60 mL/min (ref >60)
Glucose, Bld: 101 mg/dL — ABNORMAL HIGH (ref 70–99)
Potassium: 4.1 mmol/L (ref 3.5–5.1)
Sodium: 137 mmol/L (ref 135–145)

## 2024-07-12 LAB — FERRITIN: Ferritin: 56 ng/mL (ref 24–336)

## 2024-07-12 LAB — IRON AND TIBC
Iron: 68 ug/dL (ref 45–182)
Saturation Ratios: 16 % — ABNORMAL LOW (ref 17.9–39.5)
TIBC: 421 ug/dL (ref 250–450)
UIBC: 353 ug/dL

## 2024-07-14 ENCOUNTER — Inpatient Hospital Stay: Attending: Internal Medicine | Admitting: Internal Medicine

## 2024-07-14 ENCOUNTER — Inpatient Hospital Stay

## 2024-07-14 ENCOUNTER — Encounter: Payer: Self-pay | Admitting: Internal Medicine

## 2024-07-14 VITALS — BP 117/68 | HR 70 | Temp 96.8°F | Resp 12 | Ht 70.98 in | Wt 267.0 lb

## 2024-07-14 DIAGNOSIS — E291 Testicular hypofunction: Secondary | ICD-10-CM | POA: Diagnosis not present

## 2024-07-14 DIAGNOSIS — E119 Type 2 diabetes mellitus without complications: Secondary | ICD-10-CM | POA: Diagnosis not present

## 2024-07-14 DIAGNOSIS — Z7985 Long-term (current) use of injectable non-insulin antidiabetic drugs: Secondary | ICD-10-CM | POA: Diagnosis not present

## 2024-07-14 DIAGNOSIS — E611 Iron deficiency: Secondary | ICD-10-CM | POA: Insufficient documentation

## 2024-07-14 DIAGNOSIS — D649 Anemia, unspecified: Secondary | ICD-10-CM | POA: Diagnosis present

## 2024-07-14 DIAGNOSIS — Z801 Family history of malignant neoplasm of trachea, bronchus and lung: Secondary | ICD-10-CM | POA: Insufficient documentation

## 2024-07-14 DIAGNOSIS — Z807 Family history of other malignant neoplasms of lymphoid, hematopoietic and related tissues: Secondary | ICD-10-CM | POA: Insufficient documentation

## 2024-07-14 NOTE — Progress Notes (Signed)
 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  EGD/colonoscopy-[[facility]- GI-MARCH 2025 ]   HISTORY OF PRESENTING ILLNESS: Patient ambulating-independently.  Alone   Kyle Washington 73 y.o.  male pleasant patient with anemia-iron  deficiency unclear etiology is here for follow-up.  Patient is currently on oral iron .  Seems to be tolerating well except for abdominal discomfort / intermittent constipation/diarrhea. not sure if this is from Ozempic.  Patient has chronic back pain followed by physiatry.  Needing intermittent injections.  Patient is currently on gentle iron .    Review of Systems  Constitutional:  Positive for malaise/fatigue. Negative for chills, diaphoresis, fever and weight loss.  HENT:  Negative for nosebleeds and sore throat.   Eyes:  Negative for double vision.  Respiratory:  Negative for cough, hemoptysis, sputum production, shortness of breath and wheezing.   Cardiovascular:  Negative for chest pain, palpitations, orthopnea and leg swelling.  Gastrointestinal:  Negative for abdominal pain, blood in stool, constipation, diarrhea, heartburn, melena, nausea and vomiting.  Genitourinary:  Negative for dysuria, frequency and urgency.  Musculoskeletal:  Negative for back pain and joint pain.  Skin: Negative.  Negative for itching and rash.  Neurological:  Negative for dizziness, tingling, focal weakness, weakness and headaches.  Endo/Heme/Allergies:  Does not bruise/bleed easily.  Psychiatric/Behavioral:   Negative for depression. The patient is not nervous/anxious and does not have insomnia.      MEDICAL HISTORY:  Past Medical History:  Diagnosis Date   Arthritis    Barrett's esophagus    COVID-19 03/2023   Resolved   Hypertension    Hypogonadism, male    OSA on CPAP    Sleep apnea    CPAP   Type 2 diabetes mellitus (HCC)    Vertigo    several years ago   Wears hearing aid in both ears     SURGICAL HISTORY: Past Surgical History:  Procedure Laterality Date   BACK SURGERY  09/2021   CATARACT EXTRACTION W/PHACO Right 08/04/2023   Procedure: CATARACT EXTRACTION PHACO AND INTRAOCULAR LENS PLACEMENT (IOC) RIGHT DIABETIC 9.90 00:50.3;  Surgeon: Jaye Fallow, MD;  Location: [facility];  Service: Ophthalmology;  Laterality: Right;   CATARACT EXTRACTION W/PHACO Left 08/17/2023   Procedure: CATARACT EXTRACTION PHACO AND INTRAOCULAR LENS PLACEMENT (IOC) LEFT DIABETIC;  Surgeon: Jaye Fallow, MD;  Location: [facility];  Service: Ophthalmology;  Laterality: Left;  7.18 0:57.3   TOTAL HIP ARTHROPLASTY Right 11/2022    SOCIAL HISTORY: Social History   Socioeconomic History   Marital status: Married    Spouse name: Not on file   Number of children: Not on file   Years of education: Not on file   Highest education level: Not on file  Occupational History   Not on file  Tobacco Use   Smoking status: Never   Smokeless tobacco: Never  Vaping Use   Vaping status: Never Used  Substance and Sexual Activity   Alcohol use: Yes    Alcohol/week: 2.0 standard drinks of alcohol    Types: 2 Standard drinks or equivalent per week   Drug use: Never  Sexual activity: Not on file  Other Topics Concern   Not on file  Social History Narrative   Father- had Lymphoma; Hx of lung cancer.      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    Social Drivers of Corporate investment banker Strain: Low Risk  (09/24/2023)    Received from [facility]   Overall Financial Resource Strain (CARDIA)    Difficulty of Paying Living Expenses: Not hard at all  Food Insecurity: No Food Insecurity (12/30/2023)   Hunger Vital Sign    Worried About Running Out of Food in the Last Year: Never true    Ran Out of Food in the Last Year: Never true  Transportation Needs: No Transportation Needs (12/30/2023)   PRAPARE - Administrator, Civil Service (Medical): No    Lack of Transportation (Non-Medical): No  Physical Activity: Not on file  Stress: Not on file  Social Connections: Not on file  Intimate Partner Violence: Not At Risk (12/30/2023)   Humiliation, Afraid, Rape, and Kick questionnaire    Fear of Current or Ex-Partner: No    Emotionally Abused: No    Physically Abused: No    Sexually Abused: No    FAMILY HISTORY: Family History  Problem Relation Age of Onset   Lymphoma Father    Lung cancer Father    Diabetes Paternal Grandfather     ALLERGIES:  has no known allergies.  MEDICATIONS:  Current Outpatient Medications  Medication Sig Dispense Refill   acetaminophen  (TYLENOL ) 500 MG tablet Take 1,000 mg by mouth every 6 (six) hours as needed.     amLODipine  (NORVASC ) 5 MG tablet Take 5 mg by mouth daily.     atorvastatin  (LIPITOR) 20 MG tablet Take 20 mg by mouth daily.     Cholecalciferol (VITAMIN D-1000 MAX ST) 25 MCG (1000 UT) tablet Take 1,000 Units by mouth daily.     escitalopram  (LEXAPRO ) 20 MG tablet Take 20 mg by mouth daily.     Fe Bisgly-Vit C-Vit B12-FA (GENTLE IRON  PO) Take 1 capsule by mouth daily.     losartan  (COZAAR ) 100 MG tablet Take 100 mg by mouth daily.     MAGNESIUM  CITRATE PO Take 500 mg by mouth in the morning and at bedtime.     metformin (FORTAMET) 500 MG (OSM) 24 hr tablet Take 1,000 mg by mouth 2 (two) times daily with a meal.     METHYLCOBALAMIN PO Take 500 mcg by mouth daily.     Multiple Vitamins-Minerals (ONE-A-DAY MENS 50+ ADVANTAGE) TABS Take 1  tablet by mouth daily.     Omega-3 Fatty Acids (FISH OIL) 1000 MG CAPS Take 1 capsule by mouth daily.     omeprazole (PRILOSEC) 20 MG capsule Take 20 mg by mouth daily.     OVER THE COUNTER MEDICATION daily. Balance of Nature Fruits & Vegetables     OZEMPIC, 0.25 OR 0.5 MG/DOSE, 2 MG/3ML SOPN Inject 0.75 mg into the skin once a week.     pregabalin (LYRICA) 50 MG capsule Take 75 mg by mouth daily.     testosterone cypionate (DEPOTESTOSTERONE CYPIONATE) 200 MG/ML injection SMARTSIG:0.45 Milliliter(s) IM Every 2 Weeks     traZODone  (DESYREL ) 50 MG tablet Take 50-100 mg by mouth at bedtime as needed.     No current facility-administered medications for this visit.     PHYSICAL EXAMINATION:   Vitals:   07/14/24 9041  BP: 117/68  Pulse: 70  Resp: 12  Temp: (!) 96.8 F (36 C)  SpO2: 96%   Filed Weights   07/14/24 0958  Weight: 267 lb (121.1 kg)    Physical Exam Vitals and nursing note reviewed.  HENT:     Head: Normocephalic and atraumatic.     Mouth/Throat:     Pharynx: Oropharynx is clear.  Eyes:     Extraocular Movements: Extraocular movements intact.     Pupils: Pupils are equal, round, and reactive to light.  Cardiovascular:     Rate and Rhythm: Normal rate and regular rhythm.  Pulmonary:     Comments: Decreased breath sounds bilaterally.  Abdominal:     Palpations: Abdomen is soft.  Musculoskeletal:        General: Normal range of motion.     Cervical back: Normal range of motion.  Skin:    General: Skin is warm.  Neurological:     General: No focal deficit present.     Mental Status: He is alert and oriented to person, place, and time.  Psychiatric:        Behavior: Behavior normal.        Judgment: Judgment normal.      LABORATORY DATA:  I have reviewed the data as listed Lab Results  Component Value Date   WBC 6.9 07/12/2024   HGB 13.5 07/12/2024   HCT 41.4 07/12/2024   MCV 95.8 07/12/2024   PLT 303 07/12/2024   Recent Labs    03/15/24 1028  07/12/24 1014  NA 137 137  K 4.2 4.1  CL 105 104  CO2 22 24  GLUCOSE 107* 101*  BUN 28* 26*  CREATININE 1.15 1.27*  CALCIUM  8.7* 9.2  GFRNONAA >60 >60     MR LUMBAR SPINE WO CONTRAST Result Date: 06/20/2024 EXAM: MRI LUMBAR SPINE 06/20/2024 12:18:31 PM TECHNIQUE: Multiplanar multisequence MRI of the lumbar spine was performed without the administration of intravenous contrast. COMPARISON: MRI lumbar spine 07/10/2021. CLINICAL HISTORY: Lumbar radiculitis; Lower back pain x 3-4 years; NKI. FINDINGS: BONES AND ALIGNMENT: Schmorl's nodes at multiple levels in the lower thoracic and upper lumbar spine. Vertebral body heights are otherwise maintained. Degenerative endplate changes and subtle discogenic edema at L1-2. No evidence of fracture. Lumbar lordosis is maintained. Trace retrolisthesis of L1 on L2 and L2 on L3. SPINAL CORD: The conus medullaris extends to the T12-L1 level. SOFT TISSUES: Interval posterior instrumented fusion spanning L3 to L5. There is a focus of perispinal fluid within the posterior soft tissues centered at L3-4 which measures approximately 3.9 x 1.3 cm in axial dimensions and measures 2.1 cm in craniocaudal dimension. Collection likely reflects a postoperative seroma. There is mild edema within the paraspinal musculature of the lower lumbar spine. L1-L2: Degenerative endplate changes and subtle discogenic edema. No evidence of fracture. Sequelae laminectomy at L3-4 and L4-5. L2-L3: There is a diffuse disc bulge resulting in bilateral recess narrowing, mild-to-moderate facet arthrosis, and thickening of the ligamentum flavum. Similar mild spinal canal stenosis. Moderate right and mild left foraminal stenosis, increased from prior. L3-L4: There is mild disc height loss, small disc bulge, and broad-based central disc protrusion. Moderate facet arthrosis. Postsurgical changes at this level with improved patency of the spinal canal. Mild lateral recess narrowing on the right. No  significant residual spinal canal stenosis. There is mild bilateral foraminal stenosis. Facet osteophytes contact the exiting left L3 nerve root. L4-L5: There is a small disc bulge. Postsurgical changes at this level with interval improvement  in spinal canal patency. No significant residual spinal canal stenosis. Moderate bilateral facet arthrosis. There is mild bilateral foraminal stenosis, similar to prior. L5-S1: There is a small disc bulge. Severe bilateral facet arthrosis. No significant spinal canal stenosis. There is mild foraminal stenosis on the left. IMPRESSION: 1. Interval posterior instrumented fusion and posterior decompression L3 to L5. 2. Postoperative seroma in the posterior soft tissues at L3-4, measuring approximately 3.9 x 1.3 x 2.1 cm. 3. Degenerative changes as above. Mild spinal canal stenosis at L1-2 and L2-3. 4. Moderate bilateral foraminal stenosis at L1-2, slightly increased from prior. Moderate right and mild left foraminal stenosis at L2-3, increased from prior. 5. Mild bilateral foraminal stenosis at L3-4 with contact of the exiting left L3 nerve root by facet osteophytes. Electronically signed by: Donnice Mania MD 06/20/2024 12:43 PM EDT RP Workstation: [id number]    ASSESSMENT & PLAN:   Symptomatic anemia # Chronic Anemia- Hb-symptomatic.  Likely due to iron  deficiency  FEb 2025-Hb 10-  PCP- ferritin- 9.   # Currently gentle iron  [iron  biglycinate; 28 mg ] 1 pill a day.  This pill is unlikely to cause stomach upset or cause constipation.   # s/p  IV iron  infusion/ Venofer  x3- hemoglobin improved at 113.5 today.  HOLD  with Venofer  continue gentle iron . Continue PO iron .   #Etiology of iron  deficiency: Unclear; I had a long discussion with the patient regarding multiple etiologies of anemia including iron  deficiency-which is mainly caused by blood loss/malabsorption.  [facility]- [facility] evaluation-EGD colonoscopy [[facility] 2025- barretss] capsule study- HOLDING off given improvement.   Clinically less likely any malignant causes. However  urinalysis- Negative for blood. HOLD  CT scan abdomen pelvis. [facility]-No evidence of paroxysmal nocturnal hemoglobinuria ([facility])   # Diabetes- /Hypogonadism-Ozempic/metformin [testosterone q 14 days > 10 years]-stable.  # DISPOSITION: # HOLD venofer   today  # follow up  6 month- MD 2-3 days prior- labs- cbc/bmp; iron  studies;ferritin; possible venofer - Dr.B  All questions were answered. The patient knows to call the clinic with any problems, questions or concerns.   Cindy JONELLE Joe, MD 07/14/2024 10:53 AM

## 2024-07-14 NOTE — Assessment & Plan Note (Addendum)
#   Chronic Anemia- Hb-symptomatic.  Likely due to iron  deficiency  FEb 2025-Hb 10-  PCP- ferritin- 9.   # Currently gentle iron  [iron  biglycinate; 28 mg ] 1 pill a day.  This pill is unlikely to cause stomach upset or cause constipation.   # s/p  IV iron  infusion/ Venofer  x3- hemoglobin improved at 113.5 today.  HOLD  with Venofer  continue gentle iron . Continue PO iron .   #Etiology of iron  deficiency: Unclear; I had a long discussion with the patient regarding multiple etiologies of anemia including iron  deficiency-which is mainly caused by blood loss/malabsorption.  [facility]- GI evaluation-EGD colonoscopy [[facility] 2025- barretss] capsule study- HOLDING off given improvement.  Clinically less likely any malignant causes. However  urinalysis- Negative for blood. HOLD  CT scan abdomen pelvis. [facility]-No evidence of paroxysmal nocturnal hemoglobinuria ([facility])   # Diabetes- /Hypogonadism-Ozempic/metformin [testosterone q 14 days > 10 years]-stable.  # DISPOSITION: # HOLD venofer   today  # follow up  6 month- MD 2-3 days prior- labs- cbc/bmp; iron  studies;ferritin; possible venofer - Dr.B

## 2024-07-14 NOTE — Progress Notes (Signed)
 Fatigue/weakness: AT TIMES Dyspena: DOE Light headedness:  AT TIMES Blood in stool: NO   Seen Dr. Azzie for back, had xray and MRI 07/07/24, ESI.

## 2024-07-15 ENCOUNTER — Ambulatory Visit

## 2024-07-15 ENCOUNTER — Ambulatory Visit: Admitting: Internal Medicine

## 2025-01-10 ENCOUNTER — Other Ambulatory Visit

## 2025-01-12 ENCOUNTER — Ambulatory Visit: Admitting: Internal Medicine

## 2025-01-12 ENCOUNTER — Ambulatory Visit
